# Patient Record
Sex: Female | Born: 1980 | Race: White | Hispanic: No | Marital: Married | Smoking: Never smoker
Health system: Southern US, Community
[De-identification: ages and names within clinical notes are randomized; demographics above are authoritative.]

## PROBLEM LIST (undated history)

## (undated) DIAGNOSIS — Z9289 Personal history of other medical treatment: Secondary | ICD-10-CM

---

## 2010-03-11 ENCOUNTER — Inpatient Hospital Stay (HOSPITAL_COMMUNITY): Admission: AD | Admit: 2010-03-11 | Payer: Self-pay | Admitting: Obstetrics and Gynecology

## 2010-05-11 DIAGNOSIS — Z9289 Personal history of other medical treatment: Secondary | ICD-10-CM

## 2010-05-11 HISTORY — DX: Personal history of other medical treatment: Z92.89

## 2010-05-11 HISTORY — PX: OTHER SURGICAL HISTORY: SHX169

## 2010-05-29 ENCOUNTER — Inpatient Hospital Stay (HOSPITAL_COMMUNITY)
Admission: AD | Admit: 2010-05-29 | Discharge: 2010-06-01 | DRG: 370 | Disposition: A | Discharge status: Home / Self Care | Payer: BC Managed Care – PPO | Source: Ambulatory Visit | Attending: Obstetrics and Gynecology | Admitting: Obstetrics and Gynecology

## 2010-05-29 DIAGNOSIS — D649 Anemia, unspecified: Secondary | ICD-10-CM | POA: Diagnosis not present

## 2010-05-29 DIAGNOSIS — O324XX0 Maternal care for high head at term, not applicable or unspecified: Secondary | ICD-10-CM | POA: Diagnosis present

## 2010-05-29 DIAGNOSIS — O9903 Anemia complicating the puerperium: Secondary | ICD-10-CM | POA: Diagnosis not present

## 2010-05-29 LAB — CBC
Hemoglobin: 12.3 g/dL (ref 12.0–15.0)
MCH: 32.4 pg (ref 26.0–34.0)
MCV: 93.9 fL (ref 78.0–100.0)
Platelets: 133 10*3/uL — ABNORMAL LOW (ref 150–400)
RBC: 3.8 MIL/uL — ABNORMAL LOW (ref 3.87–5.11)
WBC: 12.1 10*3/uL — ABNORMAL HIGH (ref 4.0–10.5)

## 2010-05-30 LAB — CBC
HCT: 23.6 % — ABNORMAL LOW (ref 36.0–46.0)
Hemoglobin: 7.9 g/dL — ABNORMAL LOW (ref 12.0–15.0)
MCV: 93.7 fL (ref 78.0–100.0)
WBC: 20.3 10*3/uL — ABNORMAL HIGH (ref 4.0–10.5)

## 2010-05-31 LAB — CBC
MCH: 31.2 pg (ref 26.0–34.0)
MCHC: 33.2 g/dL (ref 30.0–36.0)
MCV: 94.1 fL (ref 78.0–100.0)
Platelets: 150 10*3/uL (ref 150–400)
RDW: 13.6 % (ref 11.5–15.5)

## 2010-05-31 LAB — RH IMMUNE GLOB WKUP(>/=20WKS)(NOT WOMEN'S HOSP)
Fetal Screen: NEGATIVE
Unit division: 0

## 2010-06-03 ENCOUNTER — Inpatient Hospital Stay (HOSPITAL_COMMUNITY)
Admission: AD | Admit: 2010-06-03 | Discharge: 2010-06-07 | DRG: 377 | Disposition: A | Discharge status: Home / Self Care | Payer: BC Managed Care – PPO | Source: Ambulatory Visit | Attending: Obstetrics and Gynecology | Admitting: Obstetrics and Gynecology

## 2010-06-03 DIAGNOSIS — O909 Complication of the puerperium, unspecified: Principal | ICD-10-CM | POA: Diagnosis present

## 2010-06-03 LAB — CBC
MCH: 31.4 pg (ref 26.0–34.0)
MCHC: 32.5 g/dL (ref 30.0–36.0)
Platelets: 217 10*3/uL (ref 150–400)
Platelets: 188 10*3/uL (ref 150–400)
RBC: 1.94 MIL/uL — ABNORMAL LOW (ref 3.87–5.11)
RDW: 13.6 % (ref 11.5–15.5)
WBC: 10.2 10*3/uL (ref 4.0–10.5)
WBC: 9.7 10*3/uL (ref 4.0–10.5)

## 2010-06-03 NOTE — Op Note (Signed)
NAMEALLYN, Gwendolyn Morris NO.:  [id number]  MEDICAL RECORD NO.:  [id number]           PATIENT TYPE:  I  LOCATION:  9130                          FACILITY:  [facility]  PHYSICIAN:  Juluis Mire, M.D.   DATE OF BIRTH:  09/11/1980  DATE OF PROCEDURE:  05/29/2010 DATE OF DISCHARGE:                              OPERATIVE REPORT   PREOPERATIVE DIAGNOSIS:  Pregnancy at 37 plus weeks with failure to descend.  POSTOPERATIVE DIAGNOSIS:  Pregnancy at 37 plus weeks with failure to descend.  OPERATIVE PROCEDURE:  Low transverse cesarean section.  SURGEON:  Juluis Mire, MD  ANESTHESIA:  Epidural.  ESTIMATED BLOOD LOSS:  600 mL.  PACKS AND DRAINS:  None.  INTRAOPERATIVE BLOOD PLACED:  Placed none.  COMPLICATIONS:  None.  INDICATION AS FOLLOWS:  The patient presented at 37 weeks with spontaneous rupture of membranes.  Pitocin was begun.  The patient did progress to complete dilatation after 3 hours pushing, fetal vertex remained arrested at +1 to +2 station.  Options were discussed including vacuum extractor versus cesarean section.  After discussion of risks and benefits decided to proceed with primary cesarean section.  The risks were explained including the risk of infection.  Risk of hemorrhage that could require transfusion.  The risk of AIDS or hepatitis.  Excessive bleeding could require hysterectomy.  There is a risk of injury to adjacent organs including bladder, bowel, ureters, could require further exploratory surgery.  Risk of deep venous thrombosis and pulmonary embolus.  The patient does have PAS stockings on.  PROCEDURE:  The patient was taken to OR and placed supine position with left lateral tilt.  After satisfactory level of epidural anesthesia was obtained, the abdomen was prepped out with Betadine and draped in a sterile field.  Foley had been placed in L and D.  Urine was noted to be blood tinged.  At this point in time, low transverse skin  incision was made with a knife and carried through subcutaneous tissue.  Anterior rectus fascia entered sharply.  The incision was fashioned laterally. Fascia was taken off the muscle superiorly and inferiorly.  Rectus muscles were separated in the midline.  Peritoneum was entered sharply. The incision of peritoneum was extended both superiorly and inferiorly. Low transverse bladder flap was developed.  A low transverse uterine incision was begun with knife and continued laterally using manual traction.  The infant's presented in the vertex presentation, delivered with the elevation of the head and fundal pressure.  The infant was OP. The infant was a viable female who weighed 7 pounds 2 ounces.  Apgars were 9/9.  Placenta was delivered manually.  Uterus exteriorized for closure.  Uterus closed with locking suture of 0 chromic using a two- layered closure technique.  She did have endometriosis involving the right ovary and the left ovary.  Both were somewhat adherent to the posterior wall of the uterus and there were some cul-de-sac adhesions. At this point in time the uterus was returned abdominal cavity.  We had good hemostasis.  At this point, we evaluated the bladder.  The urine was starting to clear somewhat and the Foley  tubing, we thoroughly examined the bladder.  There was no evidence of any holes whatsoever. At this point in time, muscles of the peritoneum was closed in a suture of 3-0 Vicryl.  Fascia was closed with running suture of 0-PDS.  Skin was closed staples and Steri-Strips.  Sponges, instrument, and needle count was reported as correct by the circulating nurse.  Catheter was started clear.  The patient returned to recovery room in good condition.     Juluis Mire, M.D.     JSM/MEDQ  D:  05/30/2010  T:  05/30/2010  Job:  [id number]  Electronically Signed by Richardean Chimera M.D. on 06/03/2010 06:06:03 AM

## 2010-06-04 LAB — CBC
HCT: 16.5 % — ABNORMAL LOW (ref 36.0–46.0)
Hemoglobin: 5.3 g/dL — CL (ref 12.0–15.0)
Hemoglobin: 6 g/dL — CL (ref 12.0–15.0)
Hemoglobin: 8.7 g/dL — ABNORMAL LOW (ref 12.0–15.0)
MCHC: 33.1 g/dL (ref 30.0–36.0)
MCHC: 34.4 g/dL (ref 30.0–36.0)
Platelets: 96 10*3/uL — ABNORMAL LOW (ref 150–400)
Platelets: 105 10*3/uL — ABNORMAL LOW (ref 150–400)
RBC: 1.71 MIL/uL — ABNORMAL LOW (ref 3.87–5.11)
RBC: 1.99 MIL/uL — ABNORMAL LOW (ref 3.87–5.11)
RBC: 2.64 MIL/uL — ABNORMAL LOW (ref 3.87–5.11)
RDW: 13.5 % (ref 11.5–15.5)
RDW: 14.2 % (ref 11.5–15.5)
RDW: 14.9 % (ref 11.5–15.5)
WBC: 11.4 10*3/uL — ABNORMAL HIGH (ref 4.0–10.5)
WBC: 17.5 10*3/uL — ABNORMAL HIGH (ref 4.0–10.5)

## 2010-06-04 LAB — PREPARE RBC (CROSSMATCH)

## 2010-06-04 LAB — PROTIME-INR
INR: 1.09 (ref 0.00–1.49)
Prothrombin Time: 14.3 seconds (ref 11.6–15.2)

## 2010-06-05 LAB — PREPARE FRESH FROZEN PLASMA
Unit division: 0
Unit division: 0
Unit division: 0
Unit division: 0

## 2010-06-05 LAB — CBC
HCT: 20 % — ABNORMAL LOW (ref 36.0–46.0)
Hemoglobin: 6.9 g/dL — CL (ref 12.0–15.0)
RBC: 2.32 MIL/uL — ABNORMAL LOW (ref 3.87–5.11)
WBC: 11.4 10*3/uL — ABNORMAL HIGH (ref 4.0–10.5)

## 2010-06-06 LAB — CBC
Hemoglobin: 6.4 g/dL — CL (ref 12.0–15.0)
MCH: 29.2 pg (ref 26.0–34.0)
MCV: 89.5 fL (ref 78.0–100.0)
RBC: 2.19 MIL/uL — ABNORMAL LOW (ref 3.87–5.11)
WBC: 9.7 10*3/uL (ref 4.0–10.5)

## 2010-06-07 LAB — CROSSMATCH
Antibody Screen: POSITIVE
Unit division: 0
Unit division: 0
Unit division: 0
Unit division: 0

## 2010-06-07 LAB — CBC
HCT: 19 % — ABNORMAL LOW (ref 36.0–46.0)
Hemoglobin: 6.2 g/dL — CL (ref 12.0–15.0)
MCHC: 32.6 g/dL (ref 30.0–36.0)
MCV: 91.3 fL (ref 78.0–100.0)
RDW: 15.8 % — ABNORMAL HIGH (ref 11.5–15.5)
WBC: 7.2 10*3/uL (ref 4.0–10.5)

## 2010-06-13 NOTE — Op Note (Signed)
NAMEBRANDALYN, HARTING NO.:  [id number]  MEDICAL RECORD NO.:  [id number]           PATIENT TYPE:  I  LOCATION:  9303                          [facility]ILITY:  [facility]  PHYSICIAN:  Duke Salvia. Marcelle Overlie, M.D.DATE OF BIRTH:  01/17/1981  DATE OF PROCEDURE:  06/04/2010 DATE OF DISCHARGE:                              OPERATIVE REPORT   PREOPERATIVE DIAGNOSIS:  Post cesarean section bleeding.  POSTOPERATIVE DIAGNOSIS:  Post cesarean section bleeding, plus laceration of right cervical vaginal margin.  PROCEDURE:  Exam under anesthesia, placement of Bakri balloon, followed by laparotomy with suturing of cervicovaginal laceration and repair of C- section incision.  ESTIMATED BLOOD LOSS:  02-1499 mL.  Blood products received.  She had a total of 6 units of packed red blood cells, 2 units of fresh frozen plasma.  URINE OUTPUT:  500 mL clear.  SPECIMENS REMOVED:  None.  PROCEDURE AND FINDINGS:  The patient was taken to the operating room after blood resuscitation was in progress.  After appropriate prepping and draping, the legs were placed in stirrups, Foley catheter positioned draining clear urine.  Speculum was positioned.  There was some minimal clot noted.  The anterior cervical lip was grasped with a tenaculum.  A large elephant curette was used to perform minimal curettage.  There was no significant clot or any retained tissue in the uterus.  The Bakri balloon was then positioned and inflated to 350 mL with good positioning.  At this point, there was still some brisk bleeding along the right cervical vaginal margin and I could see some arterial bleeding presumably from an extension of the C-section incision down into the right vaginal area, but I could not reach it to suture from below.  The balloon was removed.  Decision made to proceed with laparotomy.  She was prepped and draped.  The legs placed supine.  The skin separated easily, the PDS suture was cut and the  fascia was opened as were the rectus muscles and peritoneum.  The uterus was then delivered through the incision.  A bladder blade was positioned and the old C-section scar was entered.  The edges were grasped with ring forceps using suction and the assistant for exposure.  I could identify the arterial bleeding along the right cervical vaginal margin.  This was sutured with interrupted figure-of-eight 0 chromic sutures until completely hemostatic.  This was observed over 5 minutes and no further bleeding noted from that particular area.  Once this was hemostatic, the C- section incision was then closed in two layers with 0 chromic; first one, locked, second one, imbricating.  The bladder was back-filled with sterile milk and noted to be intact.  Tubes and ovaries were unremarkable.  There was some evidence of old endometriosis posteriorly with some adhesions around both ovaries and into the cul-de-sac.  Prior to closure sponge, needle, and instrument counts reported as correct x2. Peritoneum closed with a 3-0 Vicryl running suture, 3-0 Vicryl interrupted sutures used to close the rectus muscles, 0 PDS on the fascia from laterally to midline on either side.  Prior to closure of the fascia, an On-Q catheter was positioned by using  a 6-inch needle introducer through a separate stab incision and positioned per protocol, second needle introducer was then used to position an On-Q catheter in the subcutaneous space which was hemostatic.  A Monocryl 4-0 subcuticular closure on the skin with a pressure dressing.  The On-Q catheters were primed with 1% Xylocaine 4 mL at each site.  Her vital signs were stable at that point and was observed to have minimal vaginal bleeding at that point and went to recovery room in good condition.     Arland Usery M. Marcelle Overlie, M.D.     RMH/MEDQ  D:  06/04/2010  T:  06/04/2010  Job:  [id number]  Electronically Signed by Richarda Overlie M.D. on 06/13/2010 09:53:13  AM

## 2010-07-04 NOTE — Discharge Summary (Signed)
NAMECITLALY, Gwendolyn Morris NO.:  [id number]  MEDICAL RECORD NO.:  [id number]           PATIENT TYPE:  I  LOCATION:  9130                          [facility]ILITY[facility]  [facility]  PHYSICIAN:  Julio Sicks, N.P.     DATE OF BIRTH:  29-Dec-1980  DATE OF ADMISSION:  05/29/2010 DATE OF DISCHARGE:  06/01/2010                              DISCHARGE SUMMARY   ADMITTING DIAGNOSES: 1. Intrauterine pregnancy at 40 weeks' estimated gestational age. 2. Spontaneous rupture of membranes.  DISCHARGE DIAGNOSES: 1. Status post low transverse cesarean section secondary to failure to     descent. 2. Viable female infant.  PROCEDURE:  Low transverse cesarean section.  REASON FOR ADMISSION:  Please see written H and P.  HOSPITAL COURSE:  The patient is a 30 year old primigravida who was admitted to [facility] with spontaneous rupture of membranes.  The patient's labor was augmented with Pitocin and she did achieve full dilatation.  After pushing for approximately 3 hours that the vertex remaining at +1, +2 station, decision was made to proceed with a primary low transverse cesarean section.  The patient was then transferred to the operating room where spinal anesthesia was administered without difficulty.  A low transverse incision was made with delivery of a viable female infant, weighing 7 pounds and 2 ounces, Apgars of 9 at 1 minute and 9 at 5 minutes.  The patient tolerated the procedure well and was taken to the recovery room in stable condition. Later that afternoon, the patient was noted to have some excessive bleeding, physician was notified, and a number of clots were removed from the vagina and urine was draining well.  Indigo carmine was then placed into the bladder to make sure there was no injury to the bladder at the time of the delivery.  No leakage was noted from the vagina. Later that evening, no further excessive bleeding was noted.  On the following morning, the  patient was without complaint.  Vital signs were stable.  Abdomen was soft.  Fundus was firm and nontender with small amount of lochia noted.  Abdominal dressing was noted to be clean, dry, and intact.  Foley was draining with adequate amounts of urine output which was stained indigo carmine.  Laboratory findings showed hemoglobin of 7.9, platelet count 141,000, blood type is known to be O negative. CBC was ordered for the following morning and the patient was started on some iron supplementation.  On postoperative day #2, the patient was without complaint, denied any dizziness.  Vital signs were stable. Heart rate 74-94.  Abdomen was soft.  Fundus firm and nontender. Incision was clean, dry, and intact.  Repeat hemoglobin was stable at 7.4.  On postoperative day #3, the patient was without complaint.  Vital signs were stable.  Fundus was firm and nontender.  Incision was clean, dry, and intact.  Discharge instructions were reviewed and the patient was later discharged home.  CONDITION ON DISCHARGE:  Stable.  DIET:  Regular as tolerated.  ACTIVITY:  No heavy lifting, no driving x2 weeks, and no vaginal entry.  FOLLOWUP:  The patient is to follow up in the office  in 1-2 weeks for an incision check.  She is to call for temperature greater than 100 degrees, persistent nausea, vomiting, heavy vaginal bleeding, and/or redness or drainage from the incisional site.  DISCHARGE MEDICATIONS: 1. Percocet 5/325, #30 one p.o. every 4-6 hours p.r.n. 2. Motrin 600 mg every 6 hours. 3. Prenatal vitamins 1 p.o. daily. 4. Ferrous sulfate 325 mg 1 p.o. b.i.d.     Julio Sicks, N.P.     CC/MEDQ  D:  06/01/2010  T:  06/02/2010  Job:  [id number]  Electronically Signed by Julio Sicks N.P. on 06/02/2010 08:53:10 AM Electronically Signed by Zelphia Cairo MD on 07/04/2010 01:32:16 PM

## 2010-07-13 NOTE — Discharge Summary (Signed)
Gwendolyn Morris, Gwendolyn Morris NO.:  [id number]  MEDICAL RECORD NO.:  [id number]           PATIENT TYPE:  I  LOCATION:  9303                          [facility]ILITY[facility]  [facility]  PHYSICIAN:  Daud Cayer L. Darlis Wragg, M.D.DATE OF BIRTH:  1980/12/18  DATE OF ADMISSION:  06/03/2010 DATE OF DISCHARGE:  06/07/2010                              DISCHARGE SUMMARY   ADMITTING DIAGNOSES: 1. Status post cesarean delivery approximately 5 days prior to     admission. 2. Persistent vaginal bleeding.  DISCHARGE DIAGNOSES: 1. Status post repair of cervical laceration. 2. Anemia.  REASON FOR ADMISSION:  Please see written H and P.  HOSPITAL COURSE:  The patient is a 30 year old white female who presented to [facility] with persistent bright red bleeding, the patient was known to be anemic at the time of her admission and hypotensive with blood pressure of 63/42.  The patient was placed in Trendelenburg position and IV was started.  The patient was typed and crossed for blood transfusion and started on IV Pitocin. Malposition was notified, decision was made to transfer the patient to the operating room for exam under anesthesia.  Foley catheter was placed draining clear urine and speculum was positioned.  There was some minimal amount of clot that was noted.  Curettage was performed which revealed minimal clot or retained tissue in the uterus.  Bakri balloon was positioned and inflated to approximately 350 mm with good positioning.  There was some brisk bleeding that was continued from the right cervical margin and it appeared to be arterial bleeding presumably from the extension of the C-section incision into the right vaginal area, however, it was unable to be reached vaginally and decision was made to proceed with a laparotomy.  During the laparotomy, the C-section incision was opened and identification of the arterial bleeding was noted and was sutured with interrupted  figure-of-eight sutures until that was completely hemostatic.  The bladder was also backfilled to make sure that there was no interruption to the bladder.  Tubes and ovaries appeared to be unremarkable.  On-Q catheter was also placed for the patient's comfort, and she was subsequently taken to the recovery room in stable condition.  The patient did receive 4-6 units of packed red blood cells and 2 units of fresh frozen plasma.  On the following morning, the patient denied any dizziness or short of breath.  Vital signs were stable, temperature 99.3, blood pressure 94/58, pulse 94, oxygen saturations were 99-100%.  Abdomen soft.  Fundus firm, nontender. Abdominal dressing was noted to be clean, dry, and intact.  Q ball was intact x2 and scant amount of vaginal bleeding was noted.  Laboratory findings showed hemoglobin of 6.9 and platelet count of 119,000.  Foley was draining with adequate amounts of urine output.  Decision was made to discontinue the morphine PCA pump and CBC was ordered for the following morning.  On postoperative day 2, the patient was without complaint.  Vital signs remained stable.  Abdominal dressing had been removed revealing an incision that was clean, dry, and intact.  Labs were stable with hemoglobin of 6.4.  On postoperative day 2, the  patient was feeling much better.  She denied any vaginal bleeding.  Vital signs were stable.  Abdominal incision was clean, dry, and intact.  Uterus was firm.  Discharge instructions were reviewed, and the patient was later discharged home.  CONDITION ON DISCHARGE:  Stable.  DIET:  Regular as tolerated.  ACTIVITY:  No heavy lifting, no driving x2 weeks, no vaginal entry.  FOLLOWUP:  The patient will follow up in the office in 1 week for hemoglobin check.  She is to call for heavy vaginal bleeding, incisional redness or drainage, headache, blurred vision.  DISCHARGE MEDICATIONS: 1. Ferralet 90 one p.o. daily. 2. Prenatal  vitamins one p.o. daily.     Julio Sicks, N.P.   ______________________________ Stann Mainland. Vincente Poli, M.D.    CC/MEDQ  D:  07/11/2010  T:  07/11/2010  Job:  [id number]  Electronically Signed by Julio Sicks N.P. on 07/12/2010 08:49:18 AM Electronically Signed by Marcelle Overlie M.D. on 07/13/2010 08:32:19 AM

## 2013-01-01 LAB — OB RESULTS CONSOLE ANTIBODY SCREEN: ANTIBODY SCREEN: NEGATIVE

## 2013-01-01 LAB — OB RESULTS CONSOLE ABO/RH: RH TYPE: NEGATIVE

## 2013-01-01 LAB — OB RESULTS CONSOLE HEPATITIS B SURFACE ANTIGEN: HEP B S AG: NEGATIVE

## 2013-01-01 LAB — OB RESULTS CONSOLE HIV ANTIBODY (ROUTINE TESTING): HIV: NONREACTIVE

## 2013-01-01 LAB — OB RESULTS CONSOLE RUBELLA ANTIBODY, IGM: Rubella: IMMUNE

## 2013-01-01 LAB — OB RESULTS CONSOLE RPR: RPR: NONREACTIVE

## 2013-07-09 LAB — OB RESULTS CONSOLE GBS: STREP GROUP B AG: NEGATIVE

## 2013-07-23 NOTE — H&P (Signed)
Lorin Glasslison Shiner  DICTATION # [id number] CSN# [id number]   Meriel Picaichard M Morgane Joerger, MD 07/23/2013 2:13 PM

## 2013-07-24 NOTE — H&P (Signed)
Gwendolyn Morris:  Gwendolyn Morris            ACCOUNT NO.:  [id number]  MEDICAL RECORD NO.:  [id number]  LOCATION:                                 FACILITY:  PHYSICIAN:  Duke Salviaichard M. Marcelle OverlieHolland, M.D.DATE OF BIRTH:  [birth year]  DATE OF ADMISSION: DATE OF DISCHARGE:                             HISTORY & PHYSICAL   CHIEF COMPLAINT:  For repeat cesarean section at term.  HPI:  A 33 year old, G2, P1, at term, presents for repeat cesarean section.  She had a prior cesarean in 2012, she had to have a postpartum laparotomy because of arterial bleed from a cervical laceration and she did receive a blood transfusion.  She has had an uneventful twin pregnancy.  She is Rh negative.  Received RhoGAM and her 3-hour GTT was normal.  Last ultrasound at 34 weeks showed vertex low lying placenta. There was resolution of the fetal pyelectasis with a normal cervical length noted.  PAST MEDICAL HISTORY:  ALLERGIES:  SULFA.  PRIOR SURGICAL HISTORY: 1. Cesarean section. 2. Laparotomy for postpartum bleeding.  For the remainder of her past medical history, please see the [facility] form for details.  PHYSICAL EXAMINATION:  VITAL SIGNS:  Temp 98.2, blood pressure 130/78. HEENT:  Unremarkable. NECK:  Supple without masses. LUNGS:  Clear. CARDIOVASCULAR:  Regular rate and rhythm without murmurs, rubs, gallops. BREASTS:  Without masses, term fundal height.  Fetal heart rate 140. Cervix was closed. EXTREMITIES:  Unremarkable. NEUROLOGIC:  Unremarkable.  IMPRESSION:  Term pregnancy, for repeat cesarean section.  This procedure including specific risks related to bleeding, infection, transfusion, adjacent organ injury, wound infection, phlebitis reviewed with her which she understands and accepts.     Tore Carreker M. Marcelle OverlieHolland, M.D.     RMH/MEDQ  D:  07/23/2013  T:  07/24/2013  Job:  [id number]

## 2013-07-29 ENCOUNTER — Encounter (HOSPITAL_COMMUNITY): Payer: Self-pay

## 2013-07-29 NOTE — Patient Instructions (Addendum)
   Your procedure is scheduled on:  TUESDAY, MAY 26  [facility]:  6 AM Pick up the phone at the desk and dial [phone] and inform us of your arrival.  Please call this number if you have any problems the morning of surgery: [phone]  Remember: Do not eat or drink after midnight: Monday Take these medicines the morning of surgery with a SIP OF WATER: NONE  Do not wear jewelry, make-up, or FINGER nail polish No metal in your hair or on your body. Do not wear lotions, powders, perfumes.  You may wear deodorant.  Do not bring valuables to the hospital. Contacts, dentures or bridgework may not be worn into surgery.  Leave suitcase in the car. After Surgery it may be brought to your room. For patients being admitted to the hospital, checkout time is 11:00am the day of discharge.

## 2013-07-30 ENCOUNTER — Encounter (HOSPITAL_COMMUNITY)
Admission: RE | Admit: 2013-07-30 | Discharge: 2013-07-30 | Disposition: A | Discharge status: Home / Self Care | Payer: BC Managed Care – PPO | Source: Ambulatory Visit | Attending: Obstetrics and Gynecology | Admitting: Obstetrics and Gynecology

## 2013-07-30 ENCOUNTER — Encounter (HOSPITAL_COMMUNITY): Payer: Self-pay

## 2013-07-30 DIAGNOSIS — Z01812 Encounter for preprocedural laboratory examination: Secondary | ICD-10-CM | POA: Insufficient documentation

## 2013-07-30 HISTORY — DX: Personal history of other medical treatment: Z92.89

## 2013-07-30 LAB — TYPE AND SCREEN
ABO/RH(D): O NEG
Antibody Screen: POSITIVE
DAT, IgG: NEGATIVE

## 2013-07-30 LAB — CBC
HCT: 36.9 % (ref 36.0–46.0)
Hemoglobin: 12.3 g/dL (ref 12.0–15.0)
MCH: 31.4 pg (ref 26.0–34.0)
MCHC: 33.3 g/dL (ref 30.0–36.0)
MCV: 94.1 fL (ref 78.0–100.0)
PLATELETS: 123 10*3/uL — AB (ref 150–400)
RBC: 3.92 MIL/uL (ref 3.87–5.11)
RDW: 13.1 % (ref 11.5–15.5)
WBC: 10.6 10*3/uL — ABNORMAL HIGH (ref 4.0–10.5)

## 2013-07-30 LAB — RPR

## 2013-07-30 NOTE — Pre-Procedure Instructions (Signed)
Dr. Brayton CavesFreeman jackson made aware of pt platelets 123 and postpartum hemorrhage score of 20.  Orders place in computer for repeat CBC and Type and screen day of surgery.

## 2013-08-03 MED ORDER — DEXTROSE 5 % IV SOLN
2.0000 g | INTRAVENOUS | Status: AC
  Administered 2013-08-04: 2 g via INTRAVENOUS
  Filled 2013-08-03: qty 2

## 2013-08-04 ENCOUNTER — Encounter (HOSPITAL_COMMUNITY): Payer: Self-pay | Admitting: *Deleted

## 2013-08-04 ENCOUNTER — Inpatient Hospital Stay (HOSPITAL_COMMUNITY)
Admission: AD | Admit: 2013-08-04 | Payer: BC Managed Care – PPO | Source: Ambulatory Visit | Admitting: Obstetrics and Gynecology

## 2013-08-04 ENCOUNTER — Encounter (HOSPITAL_COMMUNITY): Payer: BC Managed Care – PPO | Admitting: Anesthesiology

## 2013-08-04 ENCOUNTER — Encounter (HOSPITAL_COMMUNITY)
Admission: RE | Disposition: A | Discharge status: Home / Self Care | Payer: Self-pay | Source: Ambulatory Visit | Attending: Obstetrics and Gynecology

## 2013-08-04 ENCOUNTER — Inpatient Hospital Stay (HOSPITAL_COMMUNITY): Payer: BC Managed Care – PPO | Admitting: Anesthesiology

## 2013-08-04 ENCOUNTER — Inpatient Hospital Stay (HOSPITAL_COMMUNITY)
Admission: RE | Admit: 2013-08-04 | Discharge: 2013-08-06 | DRG: 766 | Disposition: A | Discharge status: Home / Self Care | Payer: BC Managed Care – PPO | Source: Ambulatory Visit | Attending: Obstetrics and Gynecology | Admitting: Obstetrics and Gynecology

## 2013-08-04 DIAGNOSIS — O360990 Maternal care for other rhesus isoimmunization, unspecified trimester, not applicable or unspecified: Secondary | ICD-10-CM | POA: Diagnosis present

## 2013-08-04 DIAGNOSIS — N803 Endometriosis of pelvic peritoneum, unspecified: Secondary | ICD-10-CM | POA: Diagnosis present

## 2013-08-04 DIAGNOSIS — Z98891 History of uterine scar from previous surgery: Secondary | ICD-10-CM

## 2013-08-04 DIAGNOSIS — O34219 Maternal care for unspecified type scar from previous cesarean delivery: Principal | ICD-10-CM | POA: Diagnosis present

## 2013-08-04 LAB — CBC
HEMATOCRIT: 37.2 % (ref 36.0–46.0)
Hemoglobin: 12.8 g/dL (ref 12.0–15.0)
MCH: 32.4 pg (ref 26.0–34.0)
MCHC: 34.4 g/dL (ref 30.0–36.0)
MCV: 94.2 fL (ref 78.0–100.0)
Platelets: 121 10*3/uL — ABNORMAL LOW (ref 150–400)
RBC: 3.95 MIL/uL (ref 3.87–5.11)
RDW: 12.9 % (ref 11.5–15.5)
WBC: 10.8 10*3/uL — ABNORMAL HIGH (ref 4.0–10.5)

## 2013-08-04 LAB — PREPARE RBC (CROSSMATCH)

## 2013-08-04 SURGERY — Surgical Case
Anesthesia: Spinal | Site: Abdomen

## 2013-08-04 MED ORDER — NALOXONE HCL 0.4 MG/ML IJ SOLN: 0.4000 mg | INTRAMUSCULAR | Status: DC | PRN

## 2013-08-04 MED ORDER — SODIUM CHLORIDE 0.9 % IV SOLN
250.0000 mL | INTRAVENOUS | Status: DC
  Administered 2013-08-04: 250 mL via INTRAVENOUS

## 2013-08-04 MED ORDER — SCOPOLAMINE 1 MG/3DAYS TD PT72
1.0000 | MEDICATED_PATCH | Freq: Once | TRANSDERMAL | Status: DC
  Administered 2013-08-04: 1.5 mg via TRANSDERMAL

## 2013-08-04 MED ORDER — KETOROLAC TROMETHAMINE 30 MG/ML IJ SOLN
INTRAMUSCULAR | Status: AC
  Filled 2013-08-04: qty 1

## 2013-08-04 MED ORDER — KETOROLAC TROMETHAMINE 30 MG/ML IJ SOLN: 30.0000 mg | Freq: Four times a day (QID) | INTRAMUSCULAR | Status: AC | PRN

## 2013-08-04 MED ORDER — HYDROMORPHONE HCL PF 1 MG/ML IJ SOLN: 0.2500 mg | INTRAMUSCULAR | Status: DC | PRN

## 2013-08-04 MED ORDER — ONDANSETRON HCL 4 MG PO TABS: 4.0000 mg | ORAL_TABLET | ORAL | Status: DC | PRN

## 2013-08-04 MED ORDER — ONDANSETRON HCL 4 MG/2ML IJ SOLN: 4.0000 mg | Freq: Three times a day (TID) | INTRAMUSCULAR | Status: DC | PRN

## 2013-08-04 MED ORDER — ONDANSETRON HCL 4 MG/2ML IJ SOLN
INTRAMUSCULAR | Status: DC | PRN
  Administered 2013-08-04: 4 mg via INTRAVENOUS

## 2013-08-04 MED ORDER — BUPIVACAINE IN DEXTROSE 0.75-8.25 % IT SOLN
INTRATHECAL | Status: DC | PRN
  Administered 2013-08-04: 1.3 mL via INTRATHECAL

## 2013-08-04 MED ORDER — METOCLOPRAMIDE HCL 5 MG/ML IJ SOLN: 10.0000 mg | Freq: Three times a day (TID) | INTRAMUSCULAR | Status: DC | PRN

## 2013-08-04 MED ORDER — SIMETHICONE 80 MG PO CHEW
80.0000 mg | CHEWABLE_TABLET | ORAL | Status: DC
  Administered 2013-08-05 (×2): 80 mg via ORAL
  Filled 2013-08-04 (×2): qty 1

## 2013-08-04 MED ORDER — SODIUM CHLORIDE 0.9 % IJ SOLN: 3.0000 mL | Freq: Two times a day (BID) | INTRAMUSCULAR | Status: DC

## 2013-08-04 MED ORDER — LANOLIN HYDROUS EX OINT: 1.0000 "application " | TOPICAL_OINTMENT | CUTANEOUS | Status: DC | PRN

## 2013-08-04 MED ORDER — IBUPROFEN 800 MG PO TABS
800.0000 mg | ORAL_TABLET | Freq: Three times a day (TID) | ORAL | Status: DC | PRN
  Administered 2013-08-04 – 2013-08-06 (×5): 800 mg via ORAL
  Filled 2013-08-04 (×4): qty 1

## 2013-08-04 MED ORDER — KETOROLAC TROMETHAMINE 30 MG/ML IJ SOLN
30.0000 mg | Freq: Four times a day (QID) | INTRAMUSCULAR | Status: AC | PRN
  Administered 2013-08-04: 30 mg via INTRAMUSCULAR

## 2013-08-04 MED ORDER — SIMETHICONE 80 MG PO CHEW
80.0000 mg | CHEWABLE_TABLET | Freq: Three times a day (TID) | ORAL | Status: DC
  Administered 2013-08-04 – 2013-08-06 (×6): 80 mg via ORAL
  Filled 2013-08-04 (×6): qty 1

## 2013-08-04 MED ORDER — FLEET ENEMA 7-19 GM/118ML RE ENEM: 1.0000 | ENEMA | Freq: Every day | RECTAL | Status: DC | PRN

## 2013-08-04 MED ORDER — NALOXONE HCL 1 MG/ML IJ SOLN
1.0000 ug/kg/h | INTRAVENOUS | Status: DC | PRN
  Filled 2013-08-04: qty 2

## 2013-08-04 MED ORDER — SCOPOLAMINE 1 MG/3DAYS TD PT72
MEDICATED_PATCH | TRANSDERMAL | Status: AC
  Administered 2013-08-04: 1.5 mg via TRANSDERMAL
  Filled 2013-08-04: qty 1

## 2013-08-04 MED ORDER — SIMETHICONE 80 MG PO CHEW: 80.0000 mg | CHEWABLE_TABLET | ORAL | Status: DC | PRN

## 2013-08-04 MED ORDER — ONDANSETRON HCL 4 MG/2ML IJ SOLN: 4.0000 mg | INTRAMUSCULAR | Status: DC | PRN

## 2013-08-04 MED ORDER — FENTANYL CITRATE 0.05 MG/ML IJ SOLN
INTRAMUSCULAR | Status: AC
  Filled 2013-08-04: qty 2

## 2013-08-04 MED ORDER — DIPHENHYDRAMINE HCL 25 MG PO CAPS
25.0000 mg | ORAL_CAPSULE | ORAL | Status: DC | PRN
  Filled 2013-08-04: qty 1

## 2013-08-04 MED ORDER — DIBUCAINE 1 % RE OINT: 1.0000 "application " | TOPICAL_OINTMENT | RECTAL | Status: DC | PRN

## 2013-08-04 MED ORDER — PHENYLEPHRINE 8 MG IN D5W 100 ML (0.08MG/ML) PREMIX OPTIME
INJECTION | INTRAVENOUS | Status: AC
  Filled 2013-08-04: qty 100

## 2013-08-04 MED ORDER — MENTHOL 3 MG MT LOZG: 1.0000 | LOZENGE | OROMUCOSAL | Status: DC | PRN

## 2013-08-04 MED ORDER — LACTATED RINGERS IV SOLN
INTRAVENOUS | Status: DC
  Administered 2013-08-04 (×3): via INTRAVENOUS

## 2013-08-04 MED ORDER — SODIUM CHLORIDE 0.9 % IJ SOLN: 3.0000 mL | INTRAMUSCULAR | Status: DC | PRN

## 2013-08-04 MED ORDER — DIPHENHYDRAMINE HCL 50 MG/ML IJ SOLN: 12.5000 mg | INTRAMUSCULAR | Status: DC | PRN

## 2013-08-04 MED ORDER — OXYTOCIN 40 UNITS IN LACTATED RINGERS INFUSION - SIMPLE MED: 62.5000 mL/h | INTRAVENOUS | Status: AC

## 2013-08-04 MED ORDER — WITCH HAZEL-GLYCERIN EX PADS: 1.0000 "application " | MEDICATED_PAD | CUTANEOUS | Status: DC | PRN

## 2013-08-04 MED ORDER — SENNOSIDES-DOCUSATE SODIUM 8.6-50 MG PO TABS
2.0000 | ORAL_TABLET | ORAL | Status: DC
  Administered 2013-08-05 (×2): 2 via ORAL
  Filled 2013-08-04 (×2): qty 2

## 2013-08-04 MED ORDER — LACTATED RINGERS IV SOLN
40.0000 [IU] | INTRAVENOUS | Status: DC | PRN
Start: 2013-08-04 — End: 2013-08-04
  Administered 2013-08-04: 40 [IU] via INTRAVENOUS

## 2013-08-04 MED ORDER — NALBUPHINE HCL 10 MG/ML IJ SOLN: 5.0000 mg | INTRAMUSCULAR | Status: DC | PRN

## 2013-08-04 MED ORDER — DIPHENHYDRAMINE HCL 50 MG/ML IJ SOLN: 25.0000 mg | INTRAMUSCULAR | Status: DC | PRN

## 2013-08-04 MED ORDER — ONDANSETRON HCL 4 MG/2ML IJ SOLN
INTRAMUSCULAR | Status: AC
Start: 2013-08-04 — End: 2013-08-04
  Filled 2013-08-04: qty 2

## 2013-08-04 MED ORDER — MEPERIDINE HCL 25 MG/ML IJ SOLN: 6.2500 mg | INTRAMUSCULAR | Status: DC | PRN

## 2013-08-04 MED ORDER — SCOPOLAMINE 1 MG/3DAYS TD PT72: 1.0000 | MEDICATED_PATCH | Freq: Once | TRANSDERMAL | Status: DC

## 2013-08-04 MED ORDER — BISACODYL 10 MG RE SUPP: 10.0000 mg | Freq: Every day | RECTAL | Status: DC | PRN

## 2013-08-04 MED ORDER — MORPHINE SULFATE (PF) 0.5 MG/ML IJ SOLN
INTRAMUSCULAR | Status: DC | PRN
  Administered 2013-08-04: .2 mg via INTRATHECAL

## 2013-08-04 MED ORDER — OXYTOCIN 10 UNIT/ML IJ SOLN
INTRAMUSCULAR | Status: AC
Start: 2013-08-04 — End: 2013-08-04
  Filled 2013-08-04: qty 4

## 2013-08-04 MED ORDER — FENTANYL CITRATE 0.05 MG/ML IJ SOLN
INTRAMUSCULAR | Status: DC | PRN
  Administered 2013-08-04: 12.5 ug via INTRATHECAL
  Administered 2013-08-04: 50 ug via INTRAVENOUS
  Administered 2013-08-04: 37.5 ug via INTRAVENOUS

## 2013-08-04 MED ORDER — OXYCODONE-ACETAMINOPHEN 5-325 MG PO TABS
1.0000 | ORAL_TABLET | Freq: Four times a day (QID) | ORAL | Status: DC | PRN
  Administered 2013-08-05 (×2): 1 via ORAL
  Filled 2013-08-04 (×2): qty 1

## 2013-08-04 MED ORDER — PHENYLEPHRINE 8 MG IN D5W 100 ML (0.08MG/ML) PREMIX OPTIME
INJECTION | INTRAVENOUS | Status: DC | PRN
  Administered 2013-08-04: 60 ug/min via INTRAVENOUS

## 2013-08-04 MED ORDER — ZOLPIDEM TARTRATE 5 MG PO TABS: 5.0000 mg | ORAL_TABLET | Freq: Every evening | ORAL | Status: DC | PRN

## 2013-08-04 MED ORDER — MEASLES, MUMPS & RUBELLA VAC ~~LOC~~ INJ: 0.5000 mL | INJECTION | Freq: Once | SUBCUTANEOUS | Status: DC

## 2013-08-04 MED ORDER — PRENATAL MULTIVITAMIN CH
1.0000 | ORAL_TABLET | Freq: Every day | ORAL | Status: DC
  Administered 2013-08-05: 1 via ORAL
  Filled 2013-08-04: qty 1

## 2013-08-04 MED ORDER — MORPHINE SULFATE 0.5 MG/ML IJ SOLN
INTRAMUSCULAR | Status: AC
  Filled 2013-08-04: qty 10

## 2013-08-04 MED ORDER — TETANUS-DIPHTH-ACELL PERTUSSIS 5-2.5-18.5 LF-MCG/0.5 IM SUSP: 0.5000 mL | Freq: Once | INTRAMUSCULAR | Status: DC

## 2013-08-04 MED ORDER — DIPHENHYDRAMINE HCL 25 MG PO CAPS: 25.0000 mg | ORAL_CAPSULE | Freq: Four times a day (QID) | ORAL | Status: DC | PRN

## 2013-08-04 SURGICAL SUPPLY — 30 items
CLAMP CORD UMBIL (MISCELLANEOUS) IMPLANT
CLOSURE WOUND 1/2 X4 (GAUZE/BANDAGES/DRESSINGS) ×1
CLOTH BEACON ORANGE TIMEOUT ST (SAFETY) ×3 IMPLANT
DRAPE LG THREE QUARTER DISP (DRAPES) IMPLANT
DRSG OPSITE POSTOP 4X10 (GAUZE/BANDAGES/DRESSINGS) ×3 IMPLANT
DRSG TELFA 3X8 NADH (GAUZE/BANDAGES/DRESSINGS) ×3 IMPLANT
DURAPREP 26ML APPLICATOR (WOUND CARE) ×3 IMPLANT
ELECT REM PT RETURN 9FT ADLT (ELECTROSURGICAL) ×3
ELECTRODE REM PT RTRN 9FT ADLT (ELECTROSURGICAL) ×1 IMPLANT
EXTRACTOR VACUUM M CUP 4 TUBE (SUCTIONS) IMPLANT
EXTRACTOR VACUUM M CUP 4' TUBE (SUCTIONS)
GLOVE BIO SURGEON STRL SZ7 (GLOVE) ×3 IMPLANT
GOWN STRL REUS W/TWL LRG LVL3 (GOWN DISPOSABLE) ×6 IMPLANT
KIT ABG SYR 3ML LUER SLIP (SYRINGE) IMPLANT
NEEDLE HYPO 25X5/8 SAFETYGLIDE (NEEDLE) ×3 IMPLANT
NS IRRIG 1000ML POUR BTL (IV SOLUTION) ×3 IMPLANT
PACK C SECTION WH (CUSTOM PROCEDURE TRAY) ×3 IMPLANT
PAD ABD 8X7 1/2 STERILE (GAUZE/BANDAGES/DRESSINGS) ×3 IMPLANT
PAD OB MATERNITY 4.3X12.25 (PERSONAL CARE ITEMS) ×3 IMPLANT
SPONGE GAUZE 4X4 12PLY (GAUZE/BANDAGES/DRESSINGS) ×3 IMPLANT
STRIP CLOSURE SKIN 1/2X4 (GAUZE/BANDAGES/DRESSINGS) ×2 IMPLANT
SUT CHROMIC 0 CTX 36 (SUTURE) ×9 IMPLANT
SUT MON AB 4-0 PS1 27 (SUTURE) ×3 IMPLANT
SUT PDS AB 0 CT1 27 (SUTURE) ×6 IMPLANT
SUT VIC AB 3-0 CT1 27 (SUTURE) ×4
SUT VIC AB 3-0 CT1 TAPERPNT 27 (SUTURE) ×2 IMPLANT
TAPE CLOTH SURG 4X10 WHT LF (GAUZE/BANDAGES/DRESSINGS) ×3 IMPLANT
TOWEL OR 17X24 6PK STRL BLUE (TOWEL DISPOSABLE) ×3 IMPLANT
TRAY FOLEY CATH 14FR (SET/KITS/TRAYS/PACK) ×3 IMPLANT
WATER STERILE IRR 1000ML POUR (IV SOLUTION) ×3 IMPLANT

## 2013-08-04 NOTE — Anesthesia Preprocedure Evaluation (Signed)
Anesthesia Evaluation  Patient identified by MRN, date of birth, ID band Patient awake    Reviewed: Allergy & Precautions, H&P , Patient's Chart, lab work & pertinent test results  Airway Mallampati: II TM Distance: >3 FB Neck ROM: full    Dental no notable dental hx.    Pulmonary  breath sounds clear to auscultation  Pulmonary exam normal       Cardiovascular Exercise Tolerance: Good Rhythm:regular Rate:Normal     Neuro/Psych    GI/Hepatic   Endo/Other    Renal/GU      Musculoskeletal   Abdominal   Peds  Hematology   Anesthesia Other Findings   Reproductive/Obstetrics                           Anesthesia Physical Anesthesia Plan  ASA: II  Anesthesia Plan: Spinal   Post-op Pain Management:    Induction:   Airway Management Planned:   Additional Equipment:   Intra-op Plan:   Post-operative Plan:   Informed Consent: I have reviewed the patients History and Physical, chart, labs and discussed the procedure including the risks, benefits and alternatives for the proposed anesthesia with the patient or authorized representative who has indicated his/her understanding and acceptance.   Dental Advisory Given  Plan Discussed with: CRNA  Anesthesia Plan Comments: (Lab work ...... Platelets okay @121K  Discussed spinal anesthetic, and patient consents to the procedure:  included risk of possible headache,backache, failed block, allergic reaction, and nerve injury. This patient was asked if she had any questions or concerns before the procedure started. )        Anesthesia Quick Evaluation

## 2013-08-04 NOTE — Anesthesia Postprocedure Evaluation (Signed)
Anesthesia Post Note  Patient: Gwendolyn Morris  Procedure(s) Performed: Procedure(s) (LRB): CESAREAN SECTION (N/A)  Anesthesia type: Spinal  Patient location: PACU  Post pain: Pain level controlled  Post assessment: Post-op Vital signs reviewed  Last Vitals:  Filed Vitals:   08/04/13 0900  BP: 95/56  Pulse: 73  Temp:   Resp: 18    Post vital signs: Reviewed  Level of consciousness: awake  Complications: No apparent anesthesia complications

## 2013-08-04 NOTE — Op Note (Signed)
Preoperative diagnosis: Repeat cesarean section at term  Postoperative diagnosis: Same, findings there was some scarring in the cul-de-sac consistent with endometriosis.  Procedure: Repeat low transverse cesarean section  Surgeon: Marcelle Overlie  Anesthesia: Spinal  EBL: 700 cc  Complications: None  Drains: Foley, to straight drain  Procedure and findings:  Patient taken the operating room after an adequate level of spinal anesthetic was obtained with the patient in left tilt position the abdomen prepped and draped in the usual fashion, Foley catheter positioned draining clear urine. Appropriate timeout for taken at that point. The old transverse scar was then excised in the lips, the fascia was incised transversely, rectus muscles divided in the midline, peritoneum entered superiorly without incident and extended in a vertical fashion. The vesicouterine serosa was incised and the bladder was bluntly and sharply dissected below, bladder blade was repositioned at that point. Transverse incision made in the lower segment extended with bandage scissors clear fluid was noted that point. The patient delivered of a healthy female Apgars 9 and 9, the infant was suctioned, cord clamped and passed the pediatric team for further care. The placenta was then delivered manually intact, sent to labor and delivery., Uterus exteriorized cavity wiped clean with a laparotomy pack closure obtained the first layer of 0 chromic in a locked fashion followed by an imbricating layer of 0 chromic. This is hemostatic on viewing the posterior side of the uterus there was some adhesions between both ovaries and cul-de-sac scarring from old endometriosis noted. The fimbriated end both tubes appeared to be normal   Prior to closure sponge needle history counts reported as correct x2 peritoneum was enclose a running 3-0 Vicryl suture. 3-0 Vicryl interrupted sutures used to reapproximate the rectus muscles in the midline. A 0 PDS was  then used from laterally to midline on either side to close the fascia subcutaneous tissue was very thinned was undermined to reduce tension to allow better closure this was irrigated and made hemostatic with the Bovie 4-0 Monocryl subcuticular closure with pressure dressing and Steri-Strips she tolerated this well clear urine noted in the case  Dictated with [employer]  Gwendolyn Morris M. [name].D.

## 2013-08-04 NOTE — Addendum Note (Signed)
Addendum created 08/04/13 1745 by Leilani Able, MD   Modules edited: Anesthesia Attestations

## 2013-08-04 NOTE — Transfer of Care (Signed)
Immediate Anesthesia Transfer of Care Note  Patient: Gwendolyn Morris  Procedure(s) Performed: Procedure(s) with comments: CESAREAN SECTION (N/A) - REPEAT EDC 08/11/13  Patient Location: PACU  Anesthesia Type:Spinal  Level of Consciousness: awake, alert  and oriented  Airway & Oxygen Therapy: Patient Spontanous Breathing  Post-op Assessment: Report given to PACU RN and Post -op Vital signs reviewed and stable  Post vital signs: Reviewed and stable  Complications: No apparent anesthesia complications

## 2013-08-04 NOTE — Lactation Note (Signed)
This note was copied from the chart of Gwendolyn Morris. Lactation Consultation Note  Patient Name: Gwendolyn Morris MEBRA'X Date: 08/04/2013 Reason for consult: Initial assessment Mom is experienced BF and denies questions or concerns. Mom reports this baby has latched well few times. Lactation brochure left for review, advised of OP services and support group. Mom is planning to latch baby but declined assist. Advised to call for assist as needed.   Maternal Data Formula Feeding for Exclusion: No Infant to breast within first hour of birth: No Breastfeeding delayed due to:: Maternal status Has patient been taught Hand Expression?: Yes Does the patient have breastfeeding experience prior to this delivery?: Yes  Feeding Feeding Type: Breast Fed  LATCH Score/Interventions                      Lactation Tools Discussed/Used     Consult Status Consult Status: Follow-up Date: 08/05/13 Follow-up type: In-patient    Alfred Levins 08/04/2013, 5:48 PM

## 2013-08-04 NOTE — Consult Note (Signed)
[facility]  Delivery Note:  C-section       08/04/2013  8:08 AM  I was called to the operating room at the request of the patient's obstetrician (Dr. Marcelle Overlie) for a repeat c-section.  PRENATAL HX:  33 year old, G2, P1, at 26 and 0/7, presents for repeat cesarean  section.  She is Rh negative and has received Rhogam.  This pregnancy has otherwise been uncomplicated.  INTRAPARTUM HX:   Repeat c-section with AROM at delivery  DELIVERY:  Infant was vigorous at delivery, requiring no resuscitation other than standard warming, drying and stimulation.  APGARs 9 and 9.  Exam within normal limits.  After 5 minutes, baby left with nurse to assist parents with skin-to-skin care.   _____________________ Electronically Signed By: Maryan Char, MD Neonatologist

## 2013-08-04 NOTE — Anesthesia Postprocedure Evaluation (Signed)
  Anesthesia Post-op Note  Patient: Gwendolyn Morris  Procedure(s) Performed: Procedure(s) with comments: CESAREAN SECTION (N/A) - REPEAT EDC 08/11/13  Patient Location: PACU and Mother/Baby  Anesthesia Type:Spinal  Level of Consciousness: awake, alert  and oriented  Airway and Oxygen Therapy: Patient Spontanous Breathing  Post-op Pain: mild  Post-op Assessment: Patient's Cardiovascular Status Stable, Respiratory Function Stable, No signs of Nausea or vomiting, Adequate PO intake, Pain level controlled, No headache, No backache, No residual numbness and No residual motor weakness  Post-op Vital Signs: Reviewed and stable  Last Vitals:  Filed Vitals:   08/04/13 1340  BP: 88/57  Pulse: 76  Temp:   Resp: 18    Complications: No apparent anesthesia complications

## 2013-08-04 NOTE — Progress Notes (Signed)
The patient was re-examined with no change in status 

## 2013-08-04 NOTE — Anesthesia Procedure Notes (Signed)
Spinal  Patient location during procedure: OR Start time: 08/04/2013 7:27 AM End time: 08/04/2013 7:30 AM Staffing Anesthesiologist: Leilani Able Performed by: anesthesiologist  Preanesthetic Checklist Completed: patient identified, surgical consent, pre-op evaluation, timeout performed, IV checked, risks and benefits discussed and monitors and equipment checked Spinal Block Patient position: sitting Prep: DuraPrep Patient monitoring: heart rate, cardiac monitor, continuous pulse ox and blood pressure Approach: midline Location: L3-4 Injection technique: single-shot Needle Needle type: Sprotte  Needle gauge: 24 G Needle length: 9 cm Needle insertion depth: 5 cm Assessment Sensory level: T4

## 2013-08-04 NOTE — Addendum Note (Signed)
Addendum created 08/04/13 1414 by Elbert Ewings, CRNA   Modules edited: Notes Section   Notes Section:  File: [id number]

## 2013-08-05 ENCOUNTER — Encounter (HOSPITAL_COMMUNITY): Payer: Self-pay | Admitting: Obstetrics and Gynecology

## 2013-08-05 LAB — CBC
HEMATOCRIT: 30.6 % — AB (ref 36.0–46.0)
Hemoglobin: 10.6 g/dL — ABNORMAL LOW (ref 12.0–15.0)
MCH: 31.8 pg (ref 26.0–34.0)
MCHC: 34.6 g/dL (ref 30.0–36.0)
MCV: 91.9 fL (ref 78.0–100.0)
PLATELETS: 127 10*3/uL — AB (ref 150–400)
RBC: 3.33 MIL/uL — AB (ref 3.87–5.11)
RDW: 13.1 % (ref 11.5–15.5)
WBC: 11.4 10*3/uL — AB (ref 4.0–10.5)

## 2013-08-05 MED ORDER — RHO D IMMUNE GLOBULIN 1500 UNIT/2ML IJ SOSY
300.0000 ug | PREFILLED_SYRINGE | Freq: Once | INTRAMUSCULAR | Status: AC
  Administered 2013-08-05: 300 ug via INTRAMUSCULAR
  Filled 2013-08-05: qty 2

## 2013-08-05 NOTE — Progress Notes (Signed)
Subjective: Postpartum Day 1: Cesarean Delivery Patient reports tolerating PO and no problems voiding.    Objective: Vital signs in last 24 hours: Temp:  [97 F (36.1 C)-99.2 F (37.3 C)] 98.1 F (36.7 C) (05/27 0430) Pulse Rate:  [67-100] 73 (05/27 0430) Resp:  [14-28] 16 (05/27 0430) BP: (88-103)/(48-65) 93/59 mmHg (05/27 0430) SpO2:  [94 %-100 %] 96 % (05/27 0430)  Physical Exam:  General: alert and cooperative Lochia: appropriate Uterine Fundus: firm Incision: honeycomb dressing with small old drainage noted on bandage, no active bleeding observed DVT Evaluation: No evidence of DVT seen on physical exam. Negative Homan's sign. No cords or calf tenderness. No significant calf/ankle edema.   Recent Labs  08/04/13 0610 08/05/13 0600  HGB 12.8 10.6*  HCT 37.2 30.6*    Assessment/Plan: Status post Cesarean section. Doing well postoperatively.  Continue current care.  Judith Blonder 08/05/2013, 8:04 AM

## 2013-08-06 DIAGNOSIS — Z98891 History of uterine scar from previous surgery: Secondary | ICD-10-CM

## 2013-08-06 LAB — RH IG WORKUP (INCLUDES ABO/RH)
ABO/RH(D): O NEG
FETAL SCREEN: NEGATIVE
Gestational Age(Wks): 39
UNIT DIVISION: 0

## 2013-08-06 NOTE — Progress Notes (Signed)
Subjective: Postpartum Day 2: Cesarean Delivery Patient reports tolerating PO, + flatus and no problems voiding.    Objective: Vital signs in last 24 hours: Temp:  [98 F (36.7 C)-98.5 F (36.9 C)] 98 F (36.7 C) (05/28 0515) Pulse Rate:  [72-93] 93 (05/28 0515) Resp:  [18] 18 (05/28 0515) BP: (93-97)/(58-59) 93/58 mmHg (05/28 0515)  Physical Exam:  General: alert and cooperative Lochia: appropriate Uterine Fundus: firm Incision: healing well DVT Evaluation: No evidence of DVT seen on physical exam. Negative Homan's sign. No cords or calf tenderness. No significant calf/ankle edema.   Recent Labs  08/04/13 0610 08/05/13 0600  HGB 12.8 10.6*  HCT 37.2 30.6*    Assessment/Plan: Status post Cesarean section. Doing well postoperatively.  Discharge home with standard precautions and return to clinic in 1 week Unable to complete discharge orders, physician will need to complete admission orders.Judith Blonder 08/06/2013, 8:29 AM

## 2013-08-06 NOTE — Lactation Note (Signed)
This note was copied from the chart of Gwendolyn Morris. Lactation Consultation Note    Follow up consult with this mom and baby, both doing well with breast feeding. Mom c/o slightly sore nipples, No visible breakdown, but mom has been holding him in cradle to latch. i showed her how to latch in cross cradle, explained the benefits for first 2-3 weeks,and reviewed breast care. Mom knows to call for questions/concerns  Patient Name: Gwendolyn Kerrilynn Curatola JZPHX'T Date: 08/06/2013 Reason for consult: Follow-up assessment   Maternal Data    Feeding Feeding Type: Breast Fed  LATCH Score/Interventions Latch: Grasps breast easily, tongue down, lips flanged, rhythmical sucking.  Audible Swallowing: Spontaneous and intermittent  Type of Nipple: Everted at rest and after stimulation  Comfort (Breast/Nipple): Filling, red/small blisters or bruises, mild/mod discomfort  Problem noted: Mild/Moderate discomfort  Hold (Positioning): Assistance needed to correctly position infant at breast and maintain latch. Intervention(s): Breastfeeding basics reviewed;Support Pillows;Position options;Skin to skin  LATCH Score: 8  Lactation Tools Discussed/Used     Consult Status Consult Status: Complete Follow-up type: Call as needed    Alfred Levins 08/06/2013, 8:49 AM

## 2013-08-07 LAB — TYPE AND SCREEN
ABO/RH(D): O NEG
Antibody Screen: POSITIVE
DAT, IgG: NEGATIVE
UNIT DIVISION: 0
Unit division: 0

## 2013-08-14 NOTE — Discharge Summary (Signed)
Obstetric Discharge Summary Reason for Admission: cesarean section Prenatal Procedures: ultrasound Intrapartum Procedures: cesarean: low cervical, transverse Postpartum Procedures: none Complications-Operative and Postpartum: none Hemoglobin  Date Value Ref Range Status  08/05/2013 10.6* 12.0 - 15.0 g/dL Final     DELTA CHECK NOTED     REPEATED TO VERIFY     HCT  Date Value Ref Range Status  08/05/2013 30.6* 36.0 - 46.0 % Final    Physical Exam:  General: alert and cooperative Lochia: appropriate Uterine Fundus: firm Incision: healing well DVT Evaluation: No evidence of DVT seen on physical exam. Negative Homan's sign. No cords or calf tenderness. No significant calf/ankle edema.  Discharge Diagnoses: Term Pregnancy-delivered  Discharge Information: Date: 08/14/2013 Activity: pelvic rest Diet: routine Medications: PNV, Ibuprofen and Percocet Condition: stable Instructions: refer to practice specific booklet Discharge to: home   Newborn Data: Live born female  Birth Weight: 8 lb 9.7 oz (3905 g) APGAR: 9, 9  Home with mother.  Judith Blonder 08/14/2013, 8:36 AM

## 2014-01-11 ENCOUNTER — Encounter (HOSPITAL_COMMUNITY): Payer: Self-pay | Admitting: Obstetrics and Gynecology

## 2016-02-07 LAB — OB RESULTS CONSOLE ANTIBODY SCREEN: Antibody Screen: NEGATIVE

## 2016-02-07 LAB — OB RESULTS CONSOLE HIV ANTIBODY (ROUTINE TESTING): HIV: NONREACTIVE

## 2016-02-07 LAB — OB RESULTS CONSOLE ABO/RH: RH Type: NEGATIVE

## 2016-02-07 LAB — OB RESULTS CONSOLE RUBELLA ANTIBODY, IGM: Rubella: IMMUNE

## 2016-02-07 LAB — OB RESULTS CONSOLE RPR: RPR: NONREACTIVE

## 2016-02-07 LAB — OB RESULTS CONSOLE GC/CHLAMYDIA
Chlamydia: NEGATIVE
Gonorrhea: NEGATIVE

## 2016-02-07 LAB — OB RESULTS CONSOLE HEPATITIS B SURFACE ANTIGEN: Hepatitis B Surface Ag: NEGATIVE

## 2016-08-09 LAB — OB RESULTS CONSOLE GBS: STREP GROUP B AG: NEGATIVE

## 2016-08-13 NOTE — H&P (Signed)
Gwendolyn Morris  DICTATION # [id number] CSN# [id number]   [name],[name] M, MD 08/13/2016 2:00 PM

## 2016-08-14 NOTE — H&P (Signed)
[name]:  Ognibene, Maral            ACCOUNT NO.:  [id number]  MEDICAL RECORD NO.:  [id number]  LOCATION:                                 FACILITY:  PHYSICIAN:  Duke Salviaichard M. Marcelle OverlieHolland, M.D.    DATE OF BIRTH:  DATE OF ADMISSION: DATE OF DISCHARGE:                             HISTORY & PHYSICAL   CHIEF COMPLAINT:  For repeat cesarean section at term.  HISTORY OF PRESENT ILLNESS:  This is a 36 year old, G3, P2-0-0-2, at term, 2 prior cesarean sections, presents for repeat cesarean section. Her 1-hour GTT was normal at 124.  She had normal growth by ultrasound. She is Rh negative.  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  This has remained stable on ultrasound.  She presents now for repeat cesarean section.  Panoramic testing earlier was normal.  Procedure including specific risks related to bleeding, infection, transfusion, wound infection, phlebitis, all discussed with her, which she understands and accepts.  She is O negative.  Please see the [facility] form for the remainder of her family and social history.  ALLERGY:  She lists SULFA as an allergy.  PHYSICAL EXAMINATION:  VITAL SIGNS:  Temperature 98.2, blood pressure 120/78. HEENT:  Unremarkable. NECK:  Supple without masses. LUNGS:  Clear. CARDIOVASCULAR:  Regular rate and rhythm without murmurs, rubs, or gallops noted. BREASTS:  Negative. PELVIC:  Term fundal height.  Fetal heart rate 140.  Cervix closed. EXTREMITIES:  Unremarkable. NEUROLOGIC:  Unremarkable.  IMPRESSION:  Term pregnancy, previous cesarean section x2.  PLAN:  Repeat cesarean section.  Procedure and risks reviewed as above.     Mirabelle Cyphers M. Marcelle OverlieHolland, M.D.   ______________________________ Duke Salviaichard M. Marcelle OverlieHolland, M.D.    [facility]/MEDQ  D:  08/13/2016  T:  08/13/2016  Job:  [id number]

## 2016-08-21 ENCOUNTER — Encounter (HOSPITAL_COMMUNITY): Payer: Self-pay | Admitting: *Deleted

## 2016-08-22 ENCOUNTER — Telehealth (HOSPITAL_COMMUNITY): Payer: Self-pay | Admitting: *Deleted

## 2016-08-22 NOTE — Telephone Encounter (Signed)
Preadmission screen  

## 2016-08-23 ENCOUNTER — Telehealth (HOSPITAL_COMMUNITY): Payer: Self-pay | Admitting: *Deleted

## 2016-08-23 NOTE — Telephone Encounter (Signed)
Preadmission screen  

## 2016-08-24 ENCOUNTER — Encounter (HOSPITAL_COMMUNITY): Payer: Self-pay

## 2016-08-31 ENCOUNTER — Encounter (HOSPITAL_COMMUNITY)
Admission: RE | Admit: 2016-08-31 | Discharge: 2016-08-31 | Disposition: A | Discharge status: Home / Self Care | Payer: BC Managed Care – PPO | Source: Ambulatory Visit | Attending: Obstetrics and Gynecology | Admitting: Obstetrics and Gynecology

## 2016-08-31 LAB — CBC
HCT: 36.2 % (ref 36.0–46.0)
HEMOGLOBIN: 12.1 g/dL (ref 12.0–15.0)
MCH: 31.2 pg (ref 26.0–34.0)
MCHC: 33.4 g/dL (ref 30.0–36.0)
MCV: 93.3 fL (ref 78.0–100.0)
Platelets: 143 10*3/uL — ABNORMAL LOW (ref 150–400)
RBC: 3.88 MIL/uL (ref 3.87–5.11)
RDW: 13.6 % (ref 11.5–15.5)
WBC: 8.2 10*3/uL (ref 4.0–10.5)

## 2016-08-31 NOTE — Patient Instructions (Signed)
[address] Lorin Glasslison Adkison  08/31/2016   Your procedure is scheduled on:  09/03/2016  [facility].  Pick up the phone at the desk and dial [phone].   Call this number if you have problems the morning of surgery: [phone]   Remember:   Do not eat food:After Midnight.  Do not drink clear liquids: After Midnight.  Take these medicines the morning of surgery with A SIP OF WATER: none   Do not wear jewelry, make-up or nail polish.  Do not wear lotions, powders, or perfumes. Do not wear deodorant.  Do not shave 48 hours prior to surgery.  Do not bring valuables to the hospital.  [facility] is not   responsible for any belongings or valuables brought to the hospital.  Contacts, dentures or bridgework may not be worn into surgery.  Leave suitcase in the car. After surgery it may be brought to your room.  For patients admitted to the hospital, checkout time is 11:00 AM the day of              discharge.   Patients discharged the day of surgery will not be allowed to drive             home.  Name and phone number of your driver: na  Special Instructions:   N/A   Please read over the following fact sheets that you were given:   Surgical Site Infection Prevention

## 2016-09-01 LAB — RPR: RPR Ser Ql: NONREACTIVE

## 2016-09-03 ENCOUNTER — Encounter (HOSPITAL_COMMUNITY): Payer: Self-pay | Admitting: General Practice

## 2016-09-03 ENCOUNTER — Inpatient Hospital Stay (HOSPITAL_COMMUNITY): Payer: BC Managed Care – PPO | Admitting: Anesthesiology

## 2016-09-03 ENCOUNTER — Encounter (HOSPITAL_COMMUNITY)
Admission: RE | Disposition: A | Discharge status: Home / Self Care | Payer: Self-pay | Source: Ambulatory Visit | Attending: Obstetrics and Gynecology

## 2016-09-03 ENCOUNTER — Inpatient Hospital Stay (HOSPITAL_COMMUNITY)
Admission: RE | Admit: 2016-09-03 | Discharge: 2016-09-05 | DRG: 766 | Disposition: A | Discharge status: Home / Self Care | Payer: BC Managed Care – PPO | Source: Ambulatory Visit | Attending: Obstetrics and Gynecology | Admitting: Obstetrics and Gynecology

## 2016-09-03 DIAGNOSIS — Z6791 Unspecified blood type, Rh negative: Secondary | ICD-10-CM | POA: Diagnosis not present

## 2016-09-03 DIAGNOSIS — Z3490 Encounter for supervision of normal pregnancy, unspecified, unspecified trimester: Secondary | ICD-10-CM

## 2016-09-03 DIAGNOSIS — R0689 Other abnormalities of breathing: Secondary | ICD-10-CM

## 2016-09-03 DIAGNOSIS — Z3A39 39 weeks gestation of pregnancy: Secondary | ICD-10-CM | POA: Diagnosis not present

## 2016-09-03 DIAGNOSIS — Z302 Encounter for sterilization: Secondary | ICD-10-CM | POA: Diagnosis not present

## 2016-09-03 DIAGNOSIS — Z98891 History of uterine scar from previous surgery: Secondary | ICD-10-CM

## 2016-09-03 DIAGNOSIS — O34211 Maternal care for low transverse scar from previous cesarean delivery: Secondary | ICD-10-CM | POA: Diagnosis present

## 2016-09-03 DIAGNOSIS — O26893 Other specified pregnancy related conditions, third trimester: Secondary | ICD-10-CM | POA: Diagnosis present

## 2016-09-03 SURGERY — Surgical Case
Anesthesia: Spinal

## 2016-09-03 MED ORDER — MIDAZOLAM HCL 2 MG/2ML IJ SOLN: 0.5000 mg | Freq: Once | INTRAMUSCULAR | Status: DC | PRN

## 2016-09-03 MED ORDER — ONDANSETRON HCL 4 MG/2ML IJ SOLN: 4.0000 mg | Freq: Three times a day (TID) | INTRAMUSCULAR | Status: DC | PRN

## 2016-09-03 MED ORDER — NALBUPHINE HCL 10 MG/ML IJ SOLN: 5.0000 mg | Freq: Once | INTRAMUSCULAR | Status: DC | PRN

## 2016-09-03 MED ORDER — DIPHENHYDRAMINE HCL 25 MG PO CAPS
25.0000 mg | ORAL_CAPSULE | Freq: Four times a day (QID) | ORAL | Status: DC | PRN
  Filled 2016-09-03: qty 1

## 2016-09-03 MED ORDER — BISACODYL 10 MG RE SUPP
10.0000 mg | Freq: Every day | RECTAL | Status: DC | PRN
Start: 2016-09-03 — End: 2016-09-05
  Filled 2016-09-03: qty 1

## 2016-09-03 MED ORDER — ONDANSETRON HCL 4 MG/2ML IJ SOLN
INTRAMUSCULAR | Status: AC
  Filled 2016-09-03: qty 2

## 2016-09-03 MED ORDER — SENNOSIDES-DOCUSATE SODIUM 8.6-50 MG PO TABS
2.0000 | ORAL_TABLET | ORAL | Status: DC
  Administered 2016-09-03 – 2016-09-04 (×2): 2 via ORAL
  Filled 2016-09-03 (×4): qty 2

## 2016-09-03 MED ORDER — SODIUM CHLORIDE 0.9% FLUSH: 3.0000 mL | INTRAVENOUS | Status: DC | PRN

## 2016-09-03 MED ORDER — FENTANYL CITRATE (PF) 100 MCG/2ML IJ SOLN
INTRAMUSCULAR | Status: AC
  Filled 2016-09-03: qty 2

## 2016-09-03 MED ORDER — KETOROLAC TROMETHAMINE 30 MG/ML IJ SOLN: 30.0000 mg | Freq: Four times a day (QID) | INTRAMUSCULAR | Status: AC | PRN

## 2016-09-03 MED ORDER — ACETAMINOPHEN 325 MG PO TABS
650.0000 mg | ORAL_TABLET | ORAL | Status: DC | PRN
  Administered 2016-09-03 – 2016-09-04 (×5): 650 mg via ORAL
  Filled 2016-09-03 (×5): qty 2

## 2016-09-03 MED ORDER — BUPIVACAINE IN DEXTROSE 0.75-8.25 % IT SOLN
INTRATHECAL | Status: AC
  Filled 2016-09-03: qty 2

## 2016-09-03 MED ORDER — DEXTROSE 5 % IV SOLN
2.0000 g | INTRAVENOUS | Status: AC
  Administered 2016-09-03: 2 g via INTRAVENOUS
  Filled 2016-09-03: qty 2

## 2016-09-03 MED ORDER — MORPHINE SULFATE (PF) 0.5 MG/ML IJ SOLN
INTRAMUSCULAR | Status: AC
  Filled 2016-09-03: qty 10

## 2016-09-03 MED ORDER — SCOPOLAMINE 1 MG/3DAYS TD PT72: 1.0000 | MEDICATED_PATCH | Freq: Once | TRANSDERMAL | Status: DC

## 2016-09-03 MED ORDER — OXYTOCIN 10 UNIT/ML IJ SOLN
INTRAMUSCULAR | Status: AC
  Filled 2016-09-03: qty 4

## 2016-09-03 MED ORDER — DIBUCAINE 1 % RE OINT: 1.0000 "application " | TOPICAL_OINTMENT | RECTAL | Status: DC | PRN

## 2016-09-03 MED ORDER — OXYTOCIN 10 UNIT/ML IJ SOLN
INTRAMUSCULAR | Status: DC | PRN
  Administered 2016-09-03: 40 [IU] via INTRAVENOUS

## 2016-09-03 MED ORDER — LACTATED RINGERS IV SOLN
INTRAVENOUS | Status: DC | PRN
  Administered 2016-09-03 (×3): via INTRAVENOUS

## 2016-09-03 MED ORDER — FLEET ENEMA 7-19 GM/118ML RE ENEM: 1.0000 | ENEMA | Freq: Every day | RECTAL | Status: DC | PRN

## 2016-09-03 MED ORDER — SOD CITRATE-CITRIC ACID 500-334 MG/5ML PO SOLN
ORAL | Status: AC
  Filled 2016-09-03: qty 15

## 2016-09-03 MED ORDER — MORPHINE SULFATE (PF) 0.5 MG/ML IJ SOLN
INTRAMUSCULAR | Status: DC | PRN
  Administered 2016-09-03: .2 mg via INTRATHECAL

## 2016-09-03 MED ORDER — DEXTROSE IN LACTATED RINGERS 5 % IV SOLN: INTRAVENOUS | Status: DC

## 2016-09-03 MED ORDER — NALOXONE HCL 2 MG/2ML IJ SOSY
1.0000 ug/kg/h | PREFILLED_SYRINGE | INTRAVENOUS | Status: DC | PRN
  Filled 2016-09-03: qty 2

## 2016-09-03 MED ORDER — ZOLPIDEM TARTRATE 5 MG PO TABS: 5.0000 mg | ORAL_TABLET | Freq: Every evening | ORAL | Status: DC | PRN

## 2016-09-03 MED ORDER — OXYCODONE-ACETAMINOPHEN 5-325 MG PO TABS
1.0000 | ORAL_TABLET | ORAL | Status: DC | PRN
  Filled 2016-09-03: qty 1

## 2016-09-03 MED ORDER — PRENATAL MULTIVITAMIN CH
1.0000 | ORAL_TABLET | Freq: Every day | ORAL | Status: DC
  Administered 2016-09-04: 1 via ORAL
  Filled 2016-09-03: qty 1

## 2016-09-03 MED ORDER — IBUPROFEN 800 MG PO TABS
800.0000 mg | ORAL_TABLET | Freq: Three times a day (TID) | ORAL | Status: DC | PRN
  Administered 2016-09-03 – 2016-09-05 (×4): 800 mg via ORAL
  Filled 2016-09-03 (×4): qty 1

## 2016-09-03 MED ORDER — NALBUPHINE HCL 10 MG/ML IJ SOLN: 5.0000 mg | INTRAMUSCULAR | Status: DC | PRN

## 2016-09-03 MED ORDER — SCOPOLAMINE 1 MG/3DAYS TD PT72
MEDICATED_PATCH | TRANSDERMAL | Status: AC
  Filled 2016-09-03: qty 1

## 2016-09-03 MED ORDER — TETANUS-DIPHTH-ACELL PERTUSSIS 5-2.5-18.5 LF-MCG/0.5 IM SUSP: 0.5000 mL | Freq: Once | INTRAMUSCULAR | Status: DC

## 2016-09-03 MED ORDER — MEASLES, MUMPS & RUBELLA VAC ~~LOC~~ INJ: 0.5000 mL | INJECTION | Freq: Once | SUBCUTANEOUS | Status: DC

## 2016-09-03 MED ORDER — MEPERIDINE HCL 25 MG/ML IJ SOLN
6.2500 mg | INTRAMUSCULAR | Status: DC | PRN
Start: 2016-09-03 — End: 2016-09-03

## 2016-09-03 MED ORDER — MEPERIDINE HCL 25 MG/ML IJ SOLN: 6.2500 mg | INTRAMUSCULAR | Status: DC | PRN

## 2016-09-03 MED ORDER — SODIUM CHLORIDE 0.9% FLUSH: 3.0000 mL | Freq: Two times a day (BID) | INTRAVENOUS | Status: DC

## 2016-09-03 MED ORDER — DIPHENHYDRAMINE HCL 50 MG/ML IJ SOLN: 12.5000 mg | INTRAMUSCULAR | Status: DC | PRN

## 2016-09-03 MED ORDER — DIPHENHYDRAMINE HCL 25 MG PO CAPS: 25.0000 mg | ORAL_CAPSULE | ORAL | Status: DC | PRN

## 2016-09-03 MED ORDER — OXYTOCIN 40 UNITS IN LACTATED RINGERS INFUSION - SIMPLE MED
2.5000 [IU]/h | INTRAVENOUS | Status: AC
  Administered 2016-09-03: 2.5 [IU]/h via INTRAVENOUS
  Filled 2016-09-03: qty 1000

## 2016-09-03 MED ORDER — WITCH HAZEL-GLYCERIN EX PADS: 1.0000 "application " | MEDICATED_PAD | CUTANEOUS | Status: DC | PRN

## 2016-09-03 MED ORDER — SIMETHICONE 80 MG PO CHEW
80.0000 mg | CHEWABLE_TABLET | ORAL | Status: DC
  Administered 2016-09-03 – 2016-09-04 (×2): 80 mg via ORAL
  Filled 2016-09-03 (×4): qty 1

## 2016-09-03 MED ORDER — NALOXONE HCL 0.4 MG/ML IJ SOLN: 0.4000 mg | INTRAMUSCULAR | Status: DC | PRN

## 2016-09-03 MED ORDER — PHENYLEPHRINE 8 MG IN D5W 100 ML (0.08MG/ML) PREMIX OPTIME
INJECTION | INTRAVENOUS | Status: DC | PRN
  Administered 2016-09-03: 60 ug/min via INTRAVENOUS

## 2016-09-03 MED ORDER — PHENYLEPHRINE 8 MG IN D5W 100 ML (0.08MG/ML) PREMIX OPTIME
INJECTION | INTRAVENOUS | Status: AC
  Filled 2016-09-03: qty 100

## 2016-09-03 MED ORDER — OXYCODONE-ACETAMINOPHEN 5-325 MG PO TABS
2.0000 | ORAL_TABLET | ORAL | Status: DC | PRN
Start: 2016-09-03 — End: 2016-09-05

## 2016-09-03 MED ORDER — MENTHOL 3 MG MT LOZG: 1.0000 | LOZENGE | OROMUCOSAL | Status: DC | PRN

## 2016-09-03 MED ORDER — ONDANSETRON HCL 4 MG/2ML IJ SOLN
INTRAMUSCULAR | Status: DC | PRN
  Administered 2016-09-03: 4 mg via INTRAVENOUS

## 2016-09-03 MED ORDER — SODIUM CHLORIDE 0.9 % IV SOLN: 250.0000 mL | INTRAVENOUS | Status: DC

## 2016-09-03 MED ORDER — SIMETHICONE 80 MG PO CHEW
80.0000 mg | CHEWABLE_TABLET | ORAL | Status: DC | PRN
  Filled 2016-09-03: qty 1

## 2016-09-03 MED ORDER — FENTANYL CITRATE (PF) 100 MCG/2ML IJ SOLN
INTRAMUSCULAR | Status: DC | PRN
  Administered 2016-09-03: 10 ug via INTRATHECAL

## 2016-09-03 MED ORDER — COCONUT OIL OIL: 1.0000 "application " | TOPICAL_OIL | Status: DC | PRN

## 2016-09-03 MED ORDER — SOD CITRATE-CITRIC ACID 500-334 MG/5ML PO SOLN
30.0000 mL | Freq: Once | ORAL | Status: AC
Start: 2016-09-03 — End: 2016-09-03
  Administered 2016-09-03: 30 mL via ORAL

## 2016-09-03 MED ORDER — SIMETHICONE 80 MG PO CHEW
80.0000 mg | CHEWABLE_TABLET | Freq: Three times a day (TID) | ORAL | Status: DC
  Administered 2016-09-03 – 2016-09-05 (×4): 80 mg via ORAL
  Filled 2016-09-03 (×10): qty 1

## 2016-09-03 MED ORDER — SCOPOLAMINE 1 MG/3DAYS TD PT72
1.0000 | MEDICATED_PATCH | Freq: Once | TRANSDERMAL | Status: AC
  Administered 2016-09-03: 1 via TRANSDERMAL

## 2016-09-03 MED ORDER — PROMETHAZINE HCL 25 MG/ML IJ SOLN: 6.2500 mg | INTRAMUSCULAR | Status: DC | PRN

## 2016-09-03 MED ORDER — BUPIVACAINE IN DEXTROSE 0.75-8.25 % IT SOLN
INTRATHECAL | Status: DC | PRN
  Administered 2016-09-03: 10.5 mg via INTRATHECAL

## 2016-09-03 MED ORDER — MORPHINE SULFATE (PF) 4 MG/ML IV SOLN: 1.0000 mg | INTRAVENOUS | Status: DC | PRN

## 2016-09-03 MED ORDER — DEXTROSE IN LACTATED RINGERS 5 % IV SOLN
INTRAVENOUS | Status: DC
  Administered 2016-09-03 (×2): via INTRAVENOUS

## 2016-09-03 SURGICAL SUPPLY — 31 items
CHLORAPREP W/TINT 26ML (MISCELLANEOUS) ×3 IMPLANT
CLAMP CORD UMBIL (MISCELLANEOUS) IMPLANT
CLIP FILSHIE TUBAL LIGA STRL (Clip) ×3 IMPLANT
CLOSURE WOUND 1/2 X4 (GAUZE/BANDAGES/DRESSINGS) ×1
CLOTH BEACON ORANGE TIMEOUT ST (SAFETY) ×3 IMPLANT
DRSG OPSITE POSTOP 4X10 (GAUZE/BANDAGES/DRESSINGS) ×3 IMPLANT
ELECT REM PT RETURN 9FT ADLT (ELECTROSURGICAL) ×3
ELECTRODE REM PT RTRN 9FT ADLT (ELECTROSURGICAL) ×1 IMPLANT
EXTRACTOR VACUUM M CUP 4 TUBE (SUCTIONS) IMPLANT
EXTRACTOR VACUUM M CUP 4' TUBE (SUCTIONS)
GAUZE SPONGE 4X4 12PLY STRL LF (GAUZE/BANDAGES/DRESSINGS) ×3 IMPLANT
GLOVE BIO SURGEON STRL SZ7 (GLOVE) ×3 IMPLANT
GLOVE BIOGEL PI IND STRL 7.0 (GLOVE) ×2 IMPLANT
GLOVE BIOGEL PI INDICATOR 7.0 (GLOVE) ×4
GOWN STRL REUS W/TWL LRG LVL3 (GOWN DISPOSABLE) ×6 IMPLANT
KIT ABG SYR 3ML LUER SLIP (SYRINGE) IMPLANT
NEEDLE HYPO 25X5/8 SAFETYGLIDE (NEEDLE) ×3 IMPLANT
NS IRRIG 1000ML POUR BTL (IV SOLUTION) ×3 IMPLANT
PACK C SECTION WH (CUSTOM PROCEDURE TRAY) ×3 IMPLANT
PAD ABD 7.5X8 STRL (GAUZE/BANDAGES/DRESSINGS) ×3 IMPLANT
PAD OB MATERNITY 4.3X12.25 (PERSONAL CARE ITEMS) ×3 IMPLANT
PENCIL SMOKE EVAC W/HOLSTER (ELECTROSURGICAL) ×3 IMPLANT
STRIP CLOSURE SKIN 1/2X4 (GAUZE/BANDAGES/DRESSINGS) ×2 IMPLANT
SUT CHROMIC 0 CTX 36 (SUTURE) ×9 IMPLANT
SUT CHROMIC 3 0 SH 27 (SUTURE) ×3 IMPLANT
SUT MON AB 4-0 PS1 27 (SUTURE) ×3 IMPLANT
SUT PDS AB 0 CT1 27 (SUTURE) ×6 IMPLANT
SUT VIC AB 3-0 CT1 27 (SUTURE) ×4
SUT VIC AB 3-0 CT1 TAPERPNT 27 (SUTURE) ×2 IMPLANT
TOWEL OR 17X24 6PK STRL BLUE (TOWEL DISPOSABLE) ×3 IMPLANT
TRAY FOLEY BAG SILVER LF 14FR (SET/KITS/TRAYS/PACK) ×3 IMPLANT

## 2016-09-03 NOTE — Anesthesia Procedure Notes (Signed)
Spinal  Patient location during procedure: OR End time: 09/03/2016 7:29 AM Staffing Anesthesiologist: Jairo BenJACKSON, Tagen Milby Performed: anesthesiologist  Preanesthetic Checklist Completed: patient identified, surgical consent, pre-op evaluation, timeout performed, IV checked, risks and benefits discussed and monitors and equipment checked Spinal Block Patient position: sitting Prep: site prepped and draped and DuraPrep Patient monitoring: blood pressure, continuous pulse ox, cardiac monitor and heart rate Approach: midline Location: L3-4 Injection technique: single-shot Needle Needle type: Pencan  Needle gauge: 24 G Needle length: 9 cm Additional Notes Pt identified in Operating room.  Monitors applied. Working IV access confirmed. Sterile prep, drape lumbar spine.  1% lido local L 3,4.  #24ga Pencan into clear CSF L 3,4.  10.5mg  0.75% Bupivacaine with dextrose, fentanyl, morphine injected with asp CSF beginning and end of injection.  Patient asymptomatic, VSS, no heme aspirated, tolerated well.  Sandford Craze Odeal Welden, MD

## 2016-09-03 NOTE — Anesthesia Preprocedure Evaluation (Addendum)
Anesthesia Evaluation  Patient identified by MRN, date of birth, ID band Patient awake    Reviewed: Allergy & Precautions, NPO status , Patient's Chart, lab work & pertinent test results  History of Anesthesia Complications Negative for: history of anesthetic complications  Airway Mallampati: I  TM Distance: >3 FB Neck ROM: Full    Dental  (+) Dental Advisory Given   Pulmonary neg pulmonary ROS,    breath sounds clear to auscultation       Cardiovascular negative cardio ROS   Rhythm:Regular Rate:Normal     Neuro/Psych negative neurological ROS     GI/Hepatic negative GI ROS, Neg liver ROS,   Endo/Other  negative endocrine ROS  Renal/GU negative Renal ROS     Musculoskeletal negative musculoskeletal ROS (+)   Abdominal   Peds  Hematology plt 143K    Anesthesia Other Findings   Reproductive/Obstetrics (+) Pregnancy                            Anesthesia Physical Anesthesia Plan  ASA: II  Anesthesia Plan: Spinal   Post-op Pain Management:    Induction:   PONV Risk Score and Plan: 2 and Ondansetron and Dexamethasone  Airway Management Planned: Natural Airway  Additional Equipment:   Intra-op Plan:   Post-operative Plan:   Informed Consent: I have reviewed the patients History and Physical, chart, labs and discussed the procedure including the risks, benefits and alternatives for the proposed anesthesia with the patient or authorized representative who has indicated his/her understanding and acceptance.   Dental advisory given  Plan Discussed with: CRNA and Surgeon  Anesthesia Plan Comments: (Plan routine monitors, SAB)        Anesthesia Quick Evaluation

## 2016-09-03 NOTE — Op Note (Signed)
Preoperative diagnosis: Term pregnancy, previous cesarean section 2, for repeat, request permanent sterilization  Postoperative diagnosis: Same  Procedure: Repeat low transverse cesarean section, Filshie clip tubal ligation  Surgeon: Marcelle OverlieHolland  Anesthesia: Spinal  EBL: 600 cc  Procedure and findings:  Patient taken the operating room after an adequate level of spinal anesthetic was obtained with the patient in left tilt position the abdomen prepped and draped and a Foley catheter was positioned. Appropriate timeouts taken at that point. Transverse incision was made excising the old scar this is carried down the fascia which was incised and extended transversely. Rectus muscles divided in the midline, peritoneum entered superiorly without incident and extended in a vertical fashion. The vesicouterine serosa was incised and bluntly dissected below, bladder blade repositioned. Transverse incision made in the lower segment extended with blunt dissection clear fluid noted the patient then delivered of a healthy infant, clear fluid, the infant was suctioned, cord clamped and passed the pediatric team for further care. Placenta was then delivered manually intact. Uterus exteriorized, cavity wiped clean with lap laparotomy pack. This was clean closure obtained the first layer of 0 chromic in a locked fashion followed by number K layer of 0 chromic. This was hemostatic bladder flap area was intact and hemostatic. Bilateral tubes and ovaries were normal. The right tube was placed on tension with a Babcock clamp, Filshie clip applied to 3 cm from the cornu at a right ankle completely engulfing the tube with excellent application the exact same repeated on the opposite side. After this was completed the uterus was returned to the abdomen, the operative site was irrigated and aspirated noted be hemostatic prior to closure sponge, needle, history counts reported as correct 2. Peritoneum closed with 3-0 Vicryl running  suture the same on the rectus muscles in the midline 0 PDS from laterally to midline on either side for the fascia. Subcutaneous tissue was fairly thin was undermined to reduce tension this is made hemostatic with the Bovie 4-0 Monocryl subcuticular skin closure with good application and pressure dressing applied. She tolerated this well went to recovery room in good condition.  Dictated with [facility]  Gwendolyn Ulloa [name] Gwendolyn.D.

## 2016-09-03 NOTE — Anesthesia Postprocedure Evaluation (Signed)
Anesthesia Post Note  Patient: Gwendolyn Morris  Procedure(s) Performed: Procedure(s) (LRB): CESAREAN SECTION WITH BILATERAL TUBAL LIGATION (N/A)     Patient location during evaluation: Mother Baby Anesthesia Type: Spinal Level of consciousness: awake Pain management: pain level controlled Vital Signs Assessment: post-procedure vital signs reviewed and stable Respiratory status: spontaneous breathing Cardiovascular status: stable Postop Assessment: no headache, no backache, spinal receding, patient able to bend at knees, no signs of nausea or vomiting and adequate PO intake Anesthetic complications: no    Last Vitals:  Vitals:   09/03/16 0946 09/03/16 1002  BP: (!) 99/59 (!) 100/59  Pulse:  67  Resp: 18 20  Temp:  36.4 C    Last Pain:  Vitals:   09/03/16 1125  TempSrc:   PainSc: 4    Pain Goal:                 Lovena Kluck

## 2016-09-03 NOTE — Consult Note (Signed)
Neonatology Note:   Attendance at C-section:    I was asked by Dr. Marcelle OverlieHolland to attend this repeat C/S at term. The mother is a G3P2, GBS neg with good prenatal care. ROM at delivery, fluid clear. Infant vigorous with good spontaneous cry and tone. Needed only minimal bulb suctioning. Ap 8/9. Lungs clear to ausc in DR. To CN to care of Pediatrician.  Dineen Kidavid C. Leary RocaEhrmann, MD

## 2016-09-03 NOTE — Progress Notes (Signed)
Reviewed prodeure, pt decided last wk re BTL  W/ Filshie clips>>proced , permanence + risks discussed. No other changes to H and PE

## 2016-09-03 NOTE — Transfer of Care (Signed)
Immediate Anesthesia Transfer of Care Note  Patient: Gwendolyn Morris  Procedure(s) Performed: Procedure(s) with comments: CESAREAN SECTION WITH BILATERAL TUBAL LIGATION (N/A) - Repeat edc 09/09/16 allergy to sulfa need RNFA  Patient Location: PACU  Anesthesia Type:Spinal  Level of Consciousness: awake  Airway & Oxygen Therapy: Patient Spontanous Breathing  Post-op Assessment: Report given to RN and Post -op Vital signs reviewed and stable  Post vital signs: Reviewed and stable  Last Vitals:  Vitals:   09/03/16 0555  BP: 101/68  Pulse: 95  Resp: 20  Temp: 36.9 C    Last Pain:  Vitals:   09/03/16 0555  TempSrc: Oral         Complications: No apparent anesthesia complications

## 2016-09-03 NOTE — Anesthesia Postprocedure Evaluation (Signed)
Anesthesia Post Note  Patient: Lorin Glasslison Debroux  Procedure(s) Performed: Procedure(s) (LRB): CESAREAN SECTION WITH BILATERAL TUBAL LIGATION (N/A)     Patient location during evaluation: PACU Anesthesia Type: Spinal Level of consciousness: awake and alert, oriented and patient cooperative Pain management: pain level controlled Vital Signs Assessment: post-procedure vital signs reviewed and stable Respiratory status: spontaneous breathing, nonlabored ventilation and respiratory function stable Cardiovascular status: blood pressure returned to baseline and stable Postop Assessment: spinal receding and no signs of nausea or vomiting Anesthetic complications: no    Last Vitals:  Vitals:   09/03/16 0920 09/03/16 0925  BP:    Pulse: 73 (!) 34  Resp: 17 19  Temp:      Last Pain:  Vitals:   09/03/16 0900  TempSrc: Axillary   Pain Goal:                 Jeniece Hannis,E. Montoya Brandel

## 2016-09-03 NOTE — Lactation Note (Signed)
This note was copied from a baby's chart. Lactation Consultation Note  Patient Name: Gwendolyn Morris's Date: 09/03/2016 Reason for consult: Initial assessment Breastfeeding consultation services and support information given to mom.  This is her third baby and she breastfed previous babies.  Mom just finished a 30 minute feeding at breast.  Instructed on feeding cues and to put baby to breast with any cue.  Encouraged to call out for assist/concerns prn.  Maternal Data Does the patient have breastfeeding experience prior to this delivery?: Yes  Feeding Feeding Type: Breast Fed Length of feed: 20 min  LATCH Score/Interventions Latch: Grasps breast easily, tongue down, lips flanged, rhythmical sucking.  Audible Swallowing: A few with stimulation Intervention(s): Skin to skin;Hand expression;Alternate breast massage  Type of Nipple: Everted at rest and after stimulation  Comfort (Breast/Nipple): Soft / non-tender     Hold (Positioning): No assistance needed to correctly position infant at breast.  LATCH Score: 9  Lactation Tools Discussed/Used     Consult Status Consult Status: Follow-up Date: 09/04/16 Follow-up type: In-patient    Gwendolyn Morris, Keajah Killough S 09/03/2016, 1:58 PM

## 2016-09-04 ENCOUNTER — Inpatient Hospital Stay (HOSPITAL_COMMUNITY): Payer: BC Managed Care – PPO

## 2016-09-04 LAB — TYPE AND SCREEN
ABO/RH(D): O NEG
Antibody Screen: POSITIVE
DAT, IGG: NEGATIVE
UNIT DIVISION: 0
Unit division: 0

## 2016-09-04 LAB — CBC
HEMATOCRIT: 31.4 % — AB (ref 36.0–46.0)
HEMOGLOBIN: 10.8 g/dL — AB (ref 12.0–15.0)
MCH: 31.7 pg (ref 26.0–34.0)
MCHC: 34.4 g/dL (ref 30.0–36.0)
MCV: 92.1 fL (ref 78.0–100.0)
Platelets: 128 10*3/uL — ABNORMAL LOW (ref 150–400)
RBC: 3.41 MIL/uL — ABNORMAL LOW (ref 3.87–5.11)
RDW: 13.6 % (ref 11.5–15.5)
WBC: 12.1 10*3/uL — ABNORMAL HIGH (ref 4.0–10.5)

## 2016-09-04 LAB — BIRTH TISSUE RECOVERY COLLECTION (PLACENTA DONATION)

## 2016-09-04 LAB — BPAM RBC
BLOOD PRODUCT EXPIRATION DATE: 201807072359
Blood Product Expiration Date: 201807072359
Unit Type and Rh: 9500
Unit Type and Rh: 9500

## 2016-09-04 MED ORDER — RHO D IMMUNE GLOBULIN 1500 UNIT/2ML IJ SOSY
300.0000 ug | PREFILLED_SYRINGE | Freq: Once | INTRAMUSCULAR | Status: AC
  Administered 2016-09-04: 300 ug via INTRAVENOUS
  Filled 2016-09-04: qty 2

## 2016-09-04 NOTE — Progress Notes (Addendum)
The Patient called out stating that she had throw up a large amount.  Rn went into room about a liter of slightly  undigested food had been expelled which was chicken and carrots.   Patient stated that she felt a lot better and had No  problems breathing.  Afterwards patient progressed well. Vital signs were within baseline. Patient was able to void and take medicine.

## 2016-09-04 NOTE — Progress Notes (Signed)
Rn called Dr. Marcelle OverlieHolland regarding patient's complaints of problems breathing and having numb lips X1.  Patient has a history of anxiety. Patient has a history of a blood transfusion. Patient has no history of cardiac, respiratory or GI issues.   . All vital signs are within baseline. Oxygen saturation is 99 to 100 %.  Rn called Dr. Marcelle OverlieHolland stating vital signs and complaints. Dr Marcelle OverlieHolland ordered a chest xray and ordered to put  patient on oxygen if patient felt more comfortable.   The Patient was taken to xray with pulse ox measuring oxygen saturation of 99 to 100 %.

## 2016-09-04 NOTE — Lactation Note (Signed)
This note was copied from a baby's chart. Lactation Consultation Note  Patient Name: Gwendolyn Morris IONGE'XToday's Date: 09/04/2016  Mom states baby is breastfeeding well and she denies questions or concerns.  Encouraged to call out if either occurs.   Maternal Data    Feeding    LATCH Score/Interventions                      Lactation Tools Discussed/Used     Consult Status      Huston FoleyMOULDEN, Heyli Min S 09/04/2016, 6:16 PM

## 2016-09-04 NOTE — Progress Notes (Signed)
Subjective: Postpartum Day 1: Cesarean Delivery Patient reports tolerating PO and no problems voiding.    Objective: Vital signs in last 24 hours: Temp:  [96.9 F (36.1 C)-98.5 F (36.9 C)] 98.2 F (36.8 C) (06/26 0616) Pulse Rate:  [34-81] 79 (06/26 0616) Resp:  [13-24] 18 (06/26 0616) BP: (71-115)/(50-101) 106/53 (06/26 0616) SpO2:  [83 %-100 %] 99 % (06/26 0616)  Physical Exam:  General: alert and cooperative Lochia: appropriate Uterine Fundus: firm Incision: healing well DVT Evaluation: No evidence of DVT seen on physical exam. Negative Homan's sign. No cords or calf tenderness. No significant calf/ankle edema.   Recent Labs  09/04/16 0636  HGB 10.8*  HCT 31.4*    Assessment/Plan: Status post Cesarean section. Doing well postoperatively.  Continue current care.  CURTIS,CAROL G 09/04/2016, 8:44 AM

## 2016-09-04 NOTE — Progress Notes (Signed)
Rn called Dr. Marcelle OverlieHolland regarding results of chest xray, which only revealed multiple distended bowel loops and moderate gas distention.   No further orders were given.

## 2016-09-05 LAB — RH IG WORKUP (INCLUDES ABO/RH)
ABO/RH(D): O NEG
ANTIBODY SCREEN: POSITIVE
DAT, IGG: NEGATIVE
FETAL SCREEN: NEGATIVE
Gestational Age(Wks): 39.1
Unit division: 0

## 2016-09-05 MED ORDER — IBUPROFEN 800 MG PO TABS: 800.0000 mg | ORAL_TABLET | Freq: Three times a day (TID) | ORAL | 1 refills | Status: AC | PRN

## 2016-09-05 NOTE — Discharge Summary (Signed)
Obstetric Discharge Summary Reason for Admission: cesarean section Prenatal Procedures: ultrasound Intrapartum Procedures: cesarean: low cervical, transverse Postpartum Procedures: none Complications-Operative and Postpartum: none Hemoglobin  Date Value Ref Range Status  09/04/2016 10.8 (L) 12.0 - 15.0 Morris/dL Final   HCT  Date Value Ref Range Status  09/04/2016 31.4 (L) 36.0 - 46.0 % Final    Physical Exam:  General: alert and cooperative Lochia: appropriate Uterine Fundus: firm Incision: healing well DVT Evaluation: No evidence of DVT seen on physical exam. Negative Homan's sign. No cords or calf tenderness. No significant calf/ankle edema.  Discharge Diagnoses: Term Pregnancy-delivered and BTL  Discharge Information: Date: 09/05/2016 Activity: pelvic rest Diet: routine Medications: PNV and Ibuprofen Condition: stable Instructions: refer to practice specific booklet Discharge to: home   Newborn Data: Live born female  Birth Weight: 8 lb 8 oz ([id number] Morris) APGAR: 8, 9  Home with mother.  Gwendolyn Morris 09/05/2016, 9:37 AM

## 2016-09-05 NOTE — Lactation Note (Signed)
This note was copied from a baby's chart. Lactation Consultation Note  Patient Name: [name] ZOXWR'UToday's Date: 09/05/2016 Reason for consult: Follow-up assessment;Infant weight loss;Breast/nipple pain (6% weight loss , Bili check 7.2 ) Baby is 1049 hours old and mom desires to go home early. LC reviewed doc flow sheets and WNL for D/C.  Per mom baby last fed at 0730 for 25 mins , swallows noted. Mom mentioned her left nipple is sore.  LC assessed breast tissue with moms permission and noted on the left nipple /positional strip, some areola edema   also  right clear.  Mom hand expressed colostrum , and applied it to both nipples ,  LC recommended and instructed mom on the use of comfort gels X 6 days  and breast shells(  except when sleeping ). Sore nipple and engorgement prevention tx reviewed. Mom declined a hand pump , prefers her DEBP.  LC recommended is the left nipple isn't cleared by 4-5 days [facility] For [facility].  Mom also mentioned she feels the baby isn't opening wide enough even though she is using the cross cradle and football  Positions. LC reassured mom sometimes that takes time and to keep challenging the baby to open wide before latch.  Mother informed of post-discharge support and given phone number to the lactation department, including services for phone call assistance; out-patient appointments; and breastfeeding support group. List of other breastfeeding resources in the community given in the handout. Encouraged mother to call for problems or concerns related to breastfeeding.  Maternal Data Has patient been taught Hand Expression?: Yes (per mom familiar )  Feeding Feeding Type:  (baby last fed at 0730 ) Length of feed: 25 min (per mom )  LATCH Score/Interventions ( [facility] )  Latch: Grasps breast easily, tongue down, lips flanged, rhythmical sucking.  Audible Swallowing: A few with stimulation  Type of Nipple:  Everted at rest and after stimulation  Comfort (Breast/Nipple): Soft / non-tender     Hold (Positioning): No assistance needed to correctly position infant at breast. Intervention(s): Breastfeeding basics reviewed  LATCH Score: 9  Lactation Tools Discussed/Used Tools: Shells;Comfort gels (LC instructed / mom declined for hand pump , preferred her DEBP ) Shell Type: Inverted WIC Program: No Pump Review: Milk Storage (Rreviewed page [id number] Mother and baby care )   Consult Status Consult Status: Complete Date: 09/05/16    Kathrin GreathouseMargaret Ann Ski Polich 09/05/2016, 10:27 AM

## 2018-03-23 IMAGING — CR DG CHEST 2V
2 series · 2 of 2 positions shown · non-contrast
Comparison: None.

CLINICAL DATA: Dyspnea.

EXAM:
CHEST  2 VIEW

[chest pa]
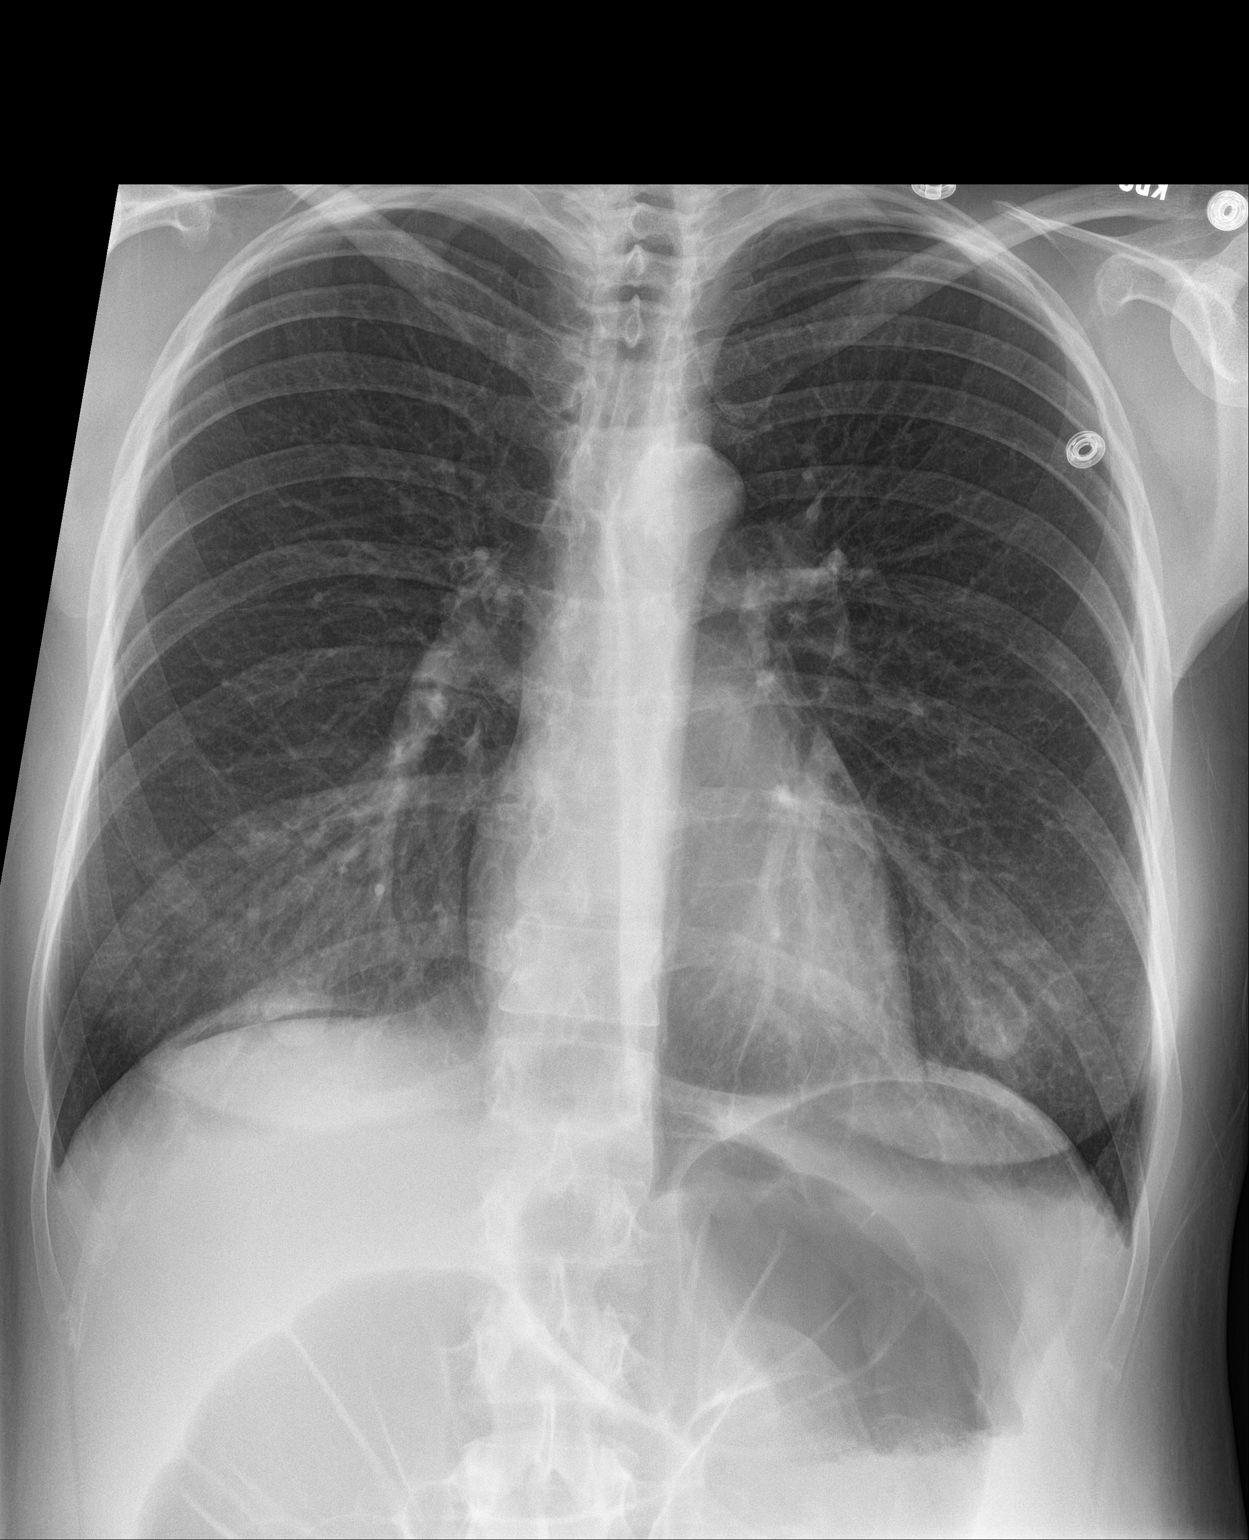

[chest lat]
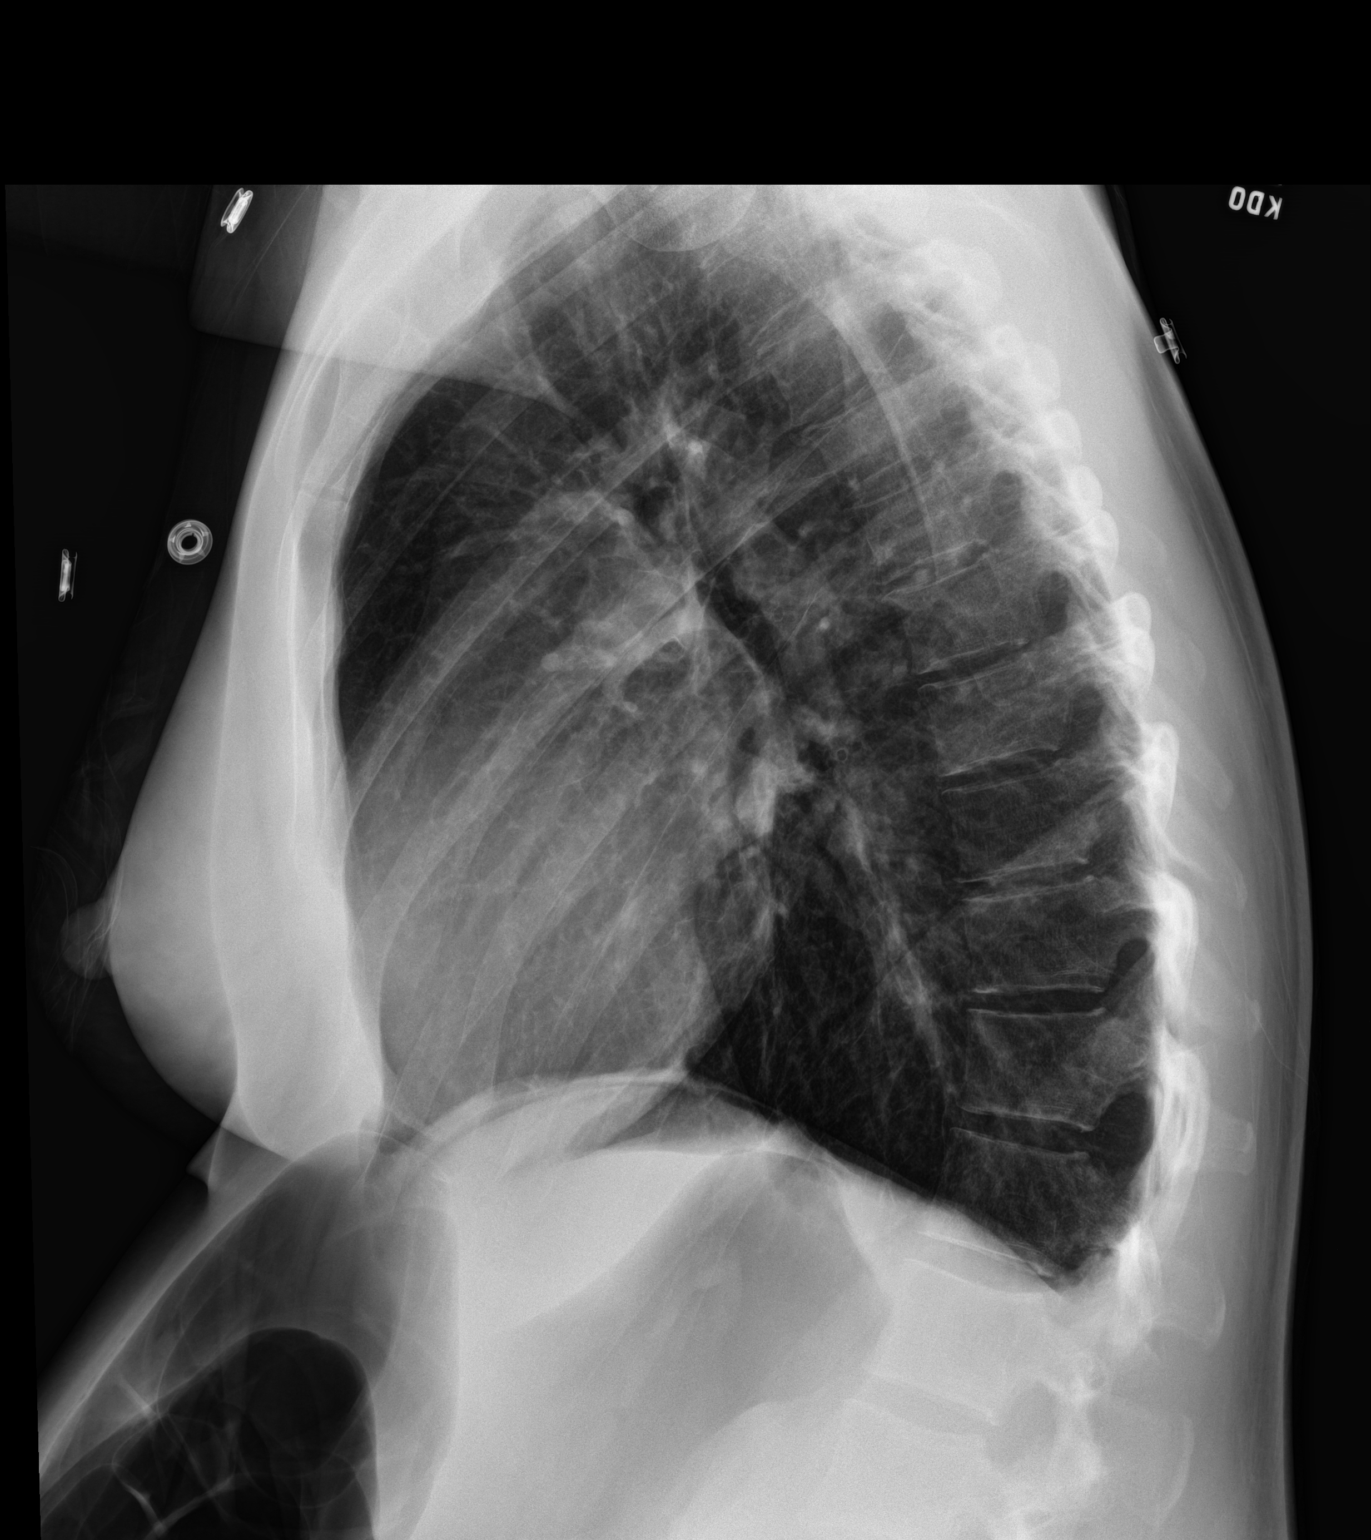

[2 of 2 positions shown; findings below may reference images not displayed]

FINDINGS: The lungs are clear. The pulmonary vasculature is normal. Heart size
is normal. Hilar and mediastinal contours are unremarkable. There is
no pleural effusion. Multiple gas -distended bowel loops are visible
in the included portions of the upper abdomen.
IMPRESSION: No active cardiopulmonary disease. Moderate gaseous distention of
bowel, incompletely imaged.
